# Patient Record
Sex: Male | Born: 1965 | Race: White | Hispanic: No | Marital: Married | Smoking: Never smoker
Health system: Southern US, Academic
[De-identification: ages and names within clinical notes are randomized; demographics above are authoritative.]

## PROBLEM LIST (undated history)

## (undated) DIAGNOSIS — M069 Rheumatoid arthritis, unspecified: Secondary | ICD-10-CM

## (undated) DIAGNOSIS — F419 Anxiety disorder, unspecified: Secondary | ICD-10-CM

## (undated) DIAGNOSIS — E7800 Pure hypercholesterolemia, unspecified: Secondary | ICD-10-CM

## (undated) DIAGNOSIS — I10 Essential (primary) hypertension: Secondary | ICD-10-CM

## (undated) DIAGNOSIS — K219 Gastro-esophageal reflux disease without esophagitis: Secondary | ICD-10-CM

## (undated) DIAGNOSIS — E039 Hypothyroidism, unspecified: Secondary | ICD-10-CM

## (undated) HISTORY — DX: Gastro-esophageal reflux disease without esophagitis: K21.9

## (undated) HISTORY — PX: HX APPENDECTOMY: SHX54

## (undated) HISTORY — DX: Anxiety disorder, unspecified: F41.9

## (undated) HISTORY — DX: Pure hypercholesterolemia, unspecified: E78.00

## (undated) HISTORY — PX: SPINE SURGERY: SHX786

## (undated) HISTORY — DX: Essential (primary) hypertension: I10

## (undated) HISTORY — PX: HX THYROIDECTOMY: SHX17

## (undated) HISTORY — PX: HX WISDOM TEETH EXTRACTION: SHX21

## (undated) HISTORY — PX: HX TONSILLECTOMY: SHX27

## (undated) HISTORY — DX: Rheumatoid arthritis, unspecified: M06.9

## (undated) HISTORY — DX: Hypothyroidism, unspecified: E03.9

---

## 2004-09-30 ENCOUNTER — Other Ambulatory Visit (HOSPITAL_COMMUNITY): Payer: Self-pay

## 2020-05-03 DIAGNOSIS — E039 Hypothyroidism, unspecified: Secondary | ICD-10-CM | POA: Insufficient documentation

## 2020-05-03 DIAGNOSIS — M5416 Radiculopathy, lumbar region: Secondary | ICD-10-CM | POA: Insufficient documentation

## 2020-05-03 DIAGNOSIS — K581 Irritable bowel syndrome with constipation: Secondary | ICD-10-CM | POA: Insufficient documentation

## 2020-05-03 DIAGNOSIS — E559 Vitamin D deficiency, unspecified: Secondary | ICD-10-CM | POA: Insufficient documentation

## 2020-05-03 DIAGNOSIS — F418 Other specified anxiety disorders: Secondary | ICD-10-CM | POA: Insufficient documentation

## 2020-05-03 DIAGNOSIS — K219 Gastro-esophageal reflux disease without esophagitis: Secondary | ICD-10-CM | POA: Insufficient documentation

## 2020-06-22 DIAGNOSIS — N138 Other obstructive and reflux uropathy: Secondary | ICD-10-CM | POA: Insufficient documentation

## 2020-06-30 DIAGNOSIS — N411 Chronic prostatitis: Secondary | ICD-10-CM | POA: Insufficient documentation

## 2020-12-01 IMAGING — MR MRI THORACIC SPINE WITHOUT CONTRAST
4 of 5 series · 26 of 48 positions shown · IV contrast (gadolinium)
Comparison: None available.

﻿EXAM:  [id number]   MRI THORACIC SPINE WITHOUT CONTRAST
INDICATION: Chronic back pain.
TECHNIQUE: Multiplanar multisequential MRI of the thoracic spine was performed without gadolinium contrast.

[Series 5: T2 · sagittal · 4.0mm · 0.78mm/px · 8 of 13 slices shown (1 of 2)]
[im 1/13]
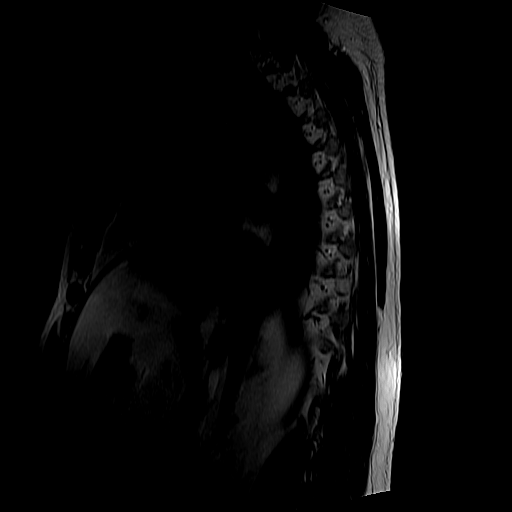
[im 2/13]
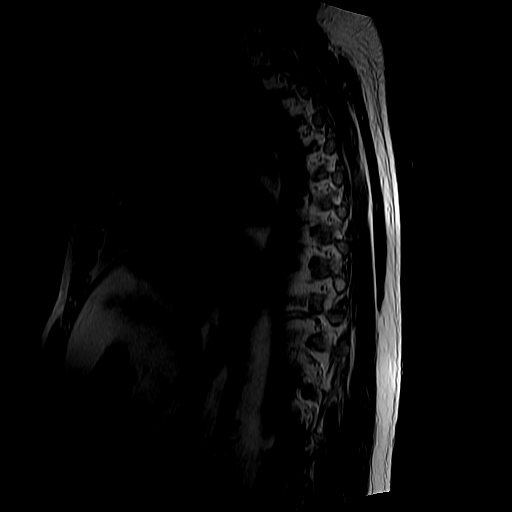
[im 5/13]
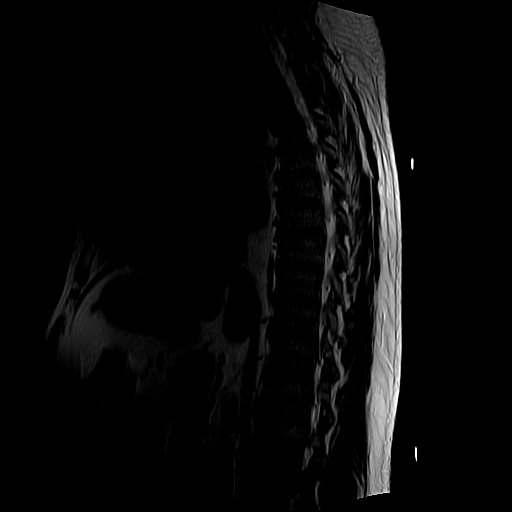
[im 6/13]
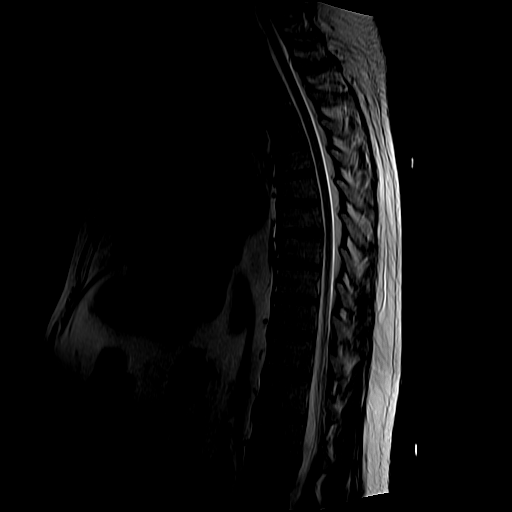
[im 7/13]
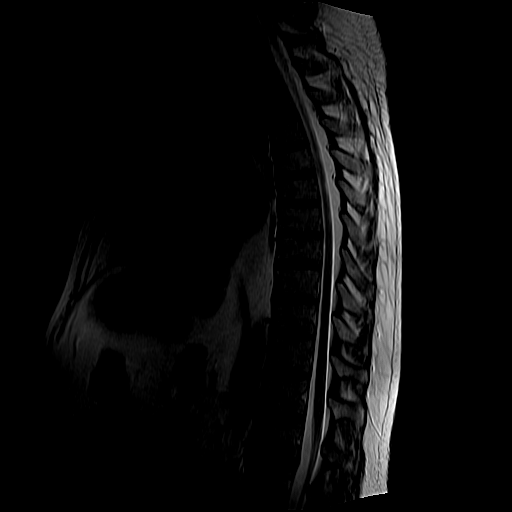
[im 9/13]
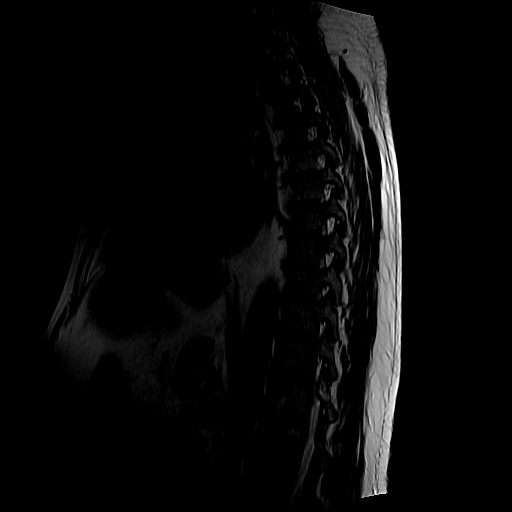
[im 11/13]
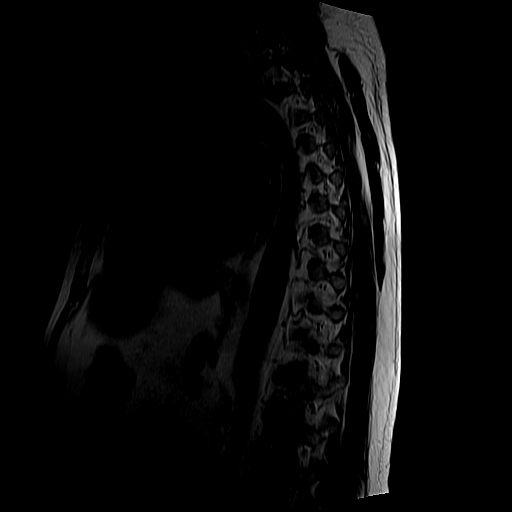
[im 13/13]
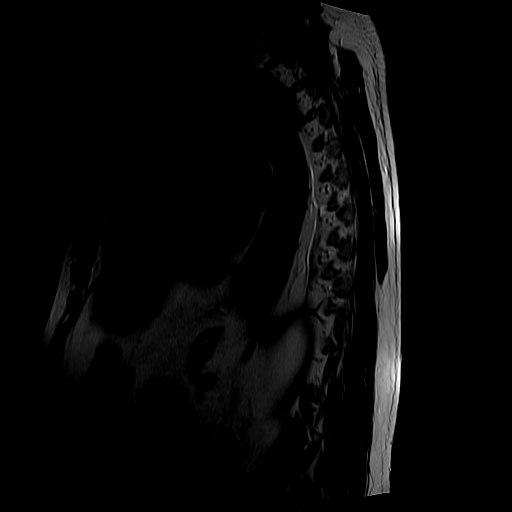

[Series 6: T1 · sagittal · 4.0mm · 0.78mm/px · 6 of 13 slices shown (1 of 2)]
[im 1/13]
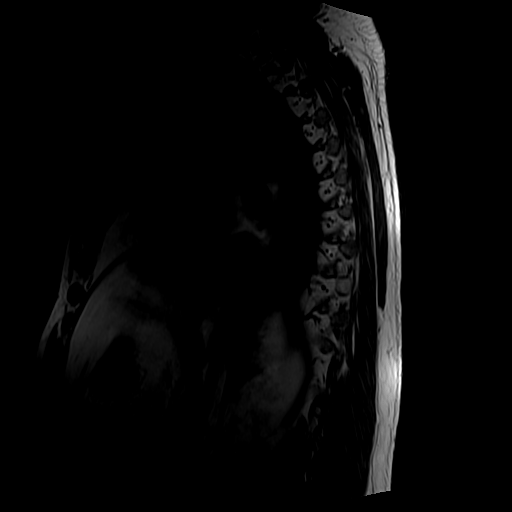
[im 2/13]
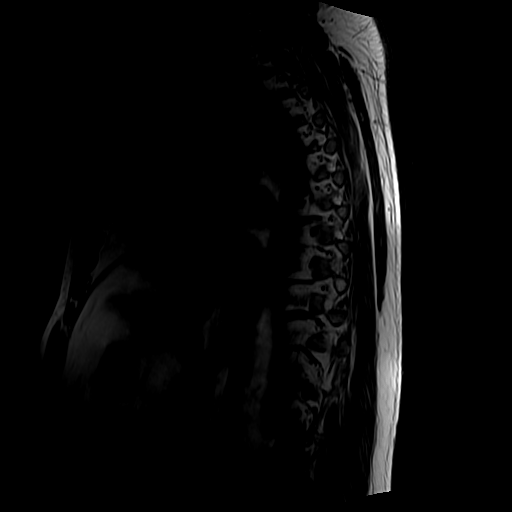
[im 5/13]
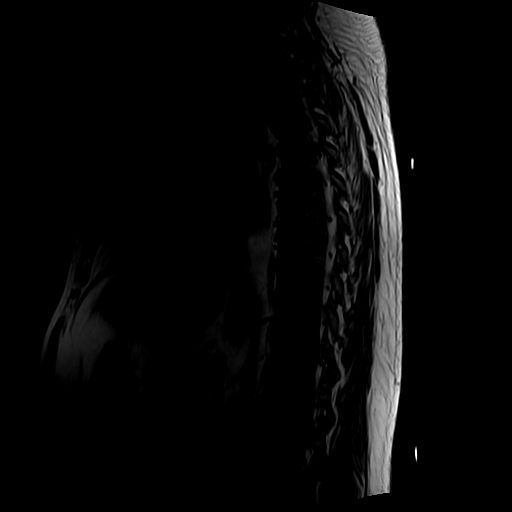
[im 6/13]
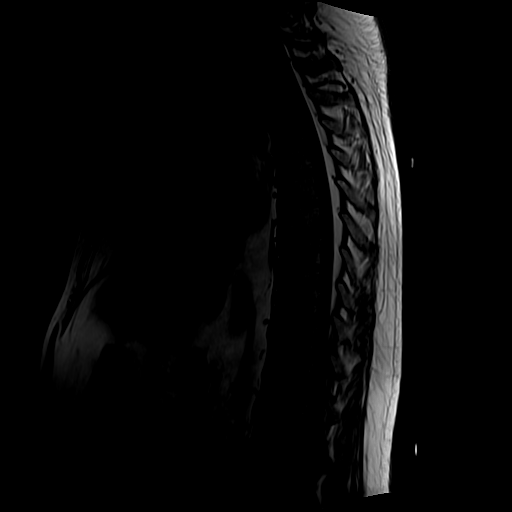
[im 7/13]
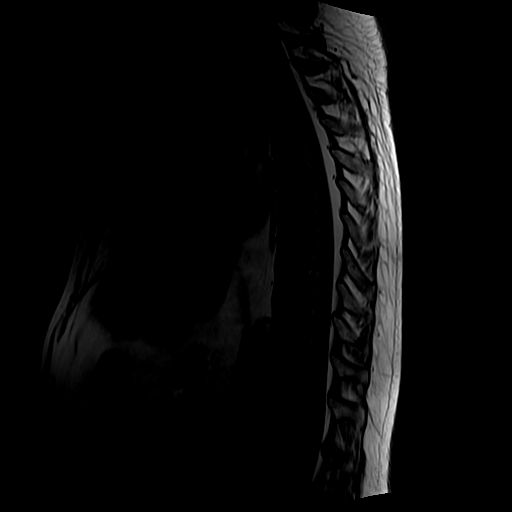
[im 11/13]
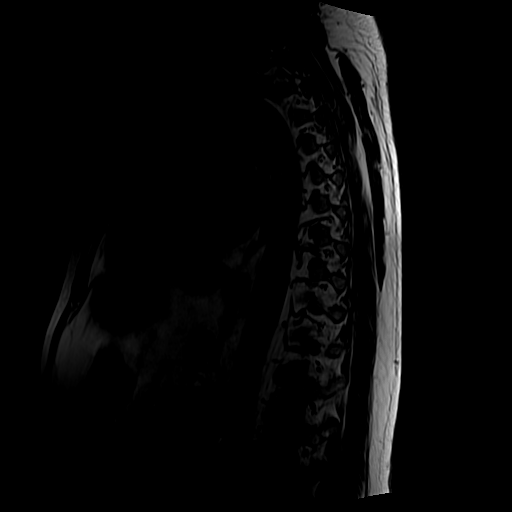

[Series 7: T2 · axial · 4.0mm · 0.62mm/px · z∈[-366,-103]mm · 9 of 12 slices shown (2 of 2)]
[im 1/12]
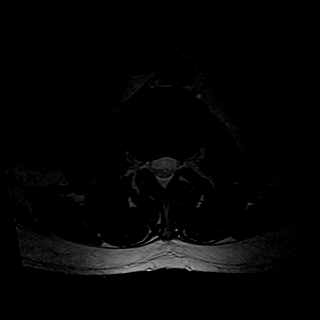
[im 2/12]
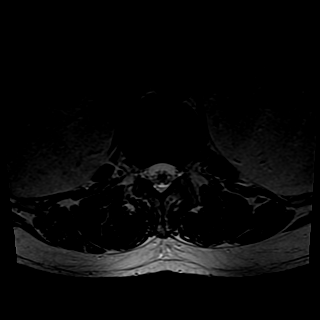
[im 3/12]
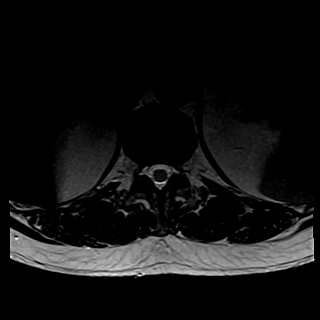
[im 5/12]
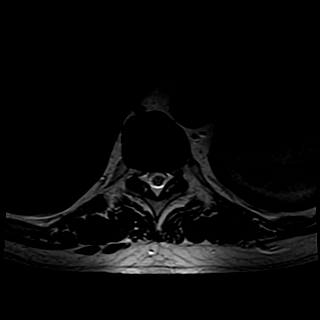
[im 6/12]
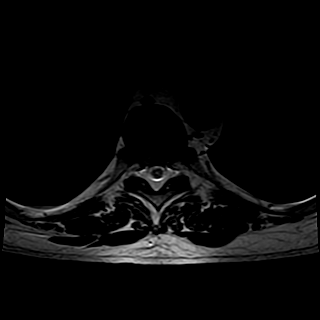
[im 7/12]
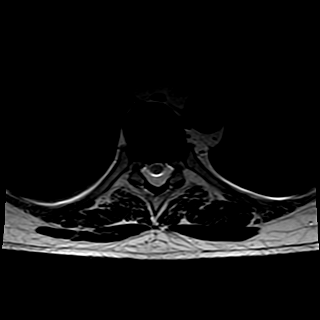
[im 9/12]
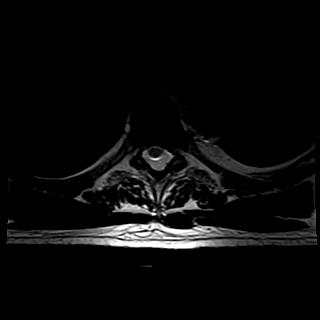
[im 10/12]
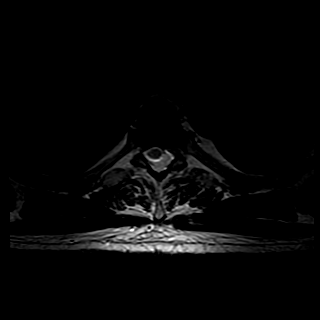
[im 12/12]
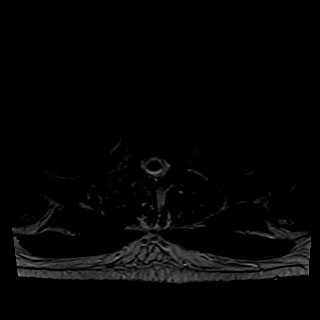

[Series 9: T1 · axial · 4.0mm · 0.62mm/px · z∈[-331,-143]mm · 3 of 12 slices shown (2 of 2)]
[im 2/12]
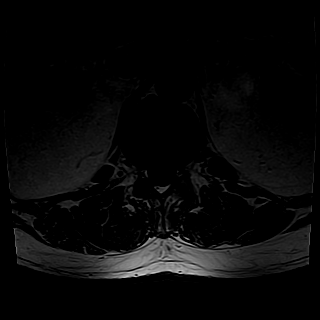
[im 6/12]
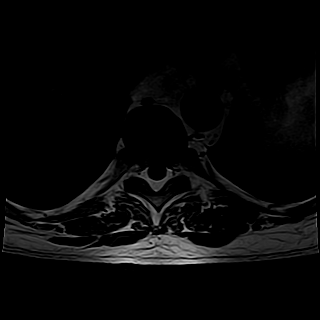
[im 10/12]
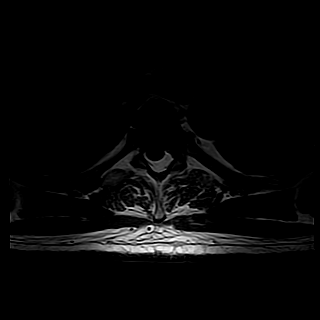

[26 of 48 positions shown; findings below may reference images not displayed]

FINDINGS: Thoracic vertebral bodies are normal in height, alignment and signal intensity. There is no acute fracture or subluxation. Visualized spinal cord is also normal in signal intensity without evidence of compression at any level.

No significant disc herniation, spinal canal or neural foraminal stenosis is seen at any level within the thoracic spine.

Paraspinal soft tissues are unremarkable. There is no pleural effusion.

Moderate to severe spinal stenosis at C3-4 and C5-6 levels is incompletely assessed.
IMPRESSION: 1. Unremarkable thoracic spine. 

2. Incompletely assessed advanced degenerative changes of the cervical spine. A dedicated MRI of the cervical spine is recommended for better assessment.

## 2020-12-10 IMAGING — CT CT HEAD W/O CONTRAST
2 series · 16 of 30 positions shown, 20 images · non-contrast
Comparison: None available.

﻿EXAM:  CT HEAD W/O CONTRAST
INDICATION: Headaches, dementia.
TECHNIQUE: Axial noncontrast CT imaging was performed from skull base to the vertex. Exam was performed using 1 or more of the following dose reduction techniques: Automated exposure control, adjustment of the mA and/or kV according to patient size, or the use of iterative reconstruction technique.

[ax · axial · 0.44mm/px · z∈[-438,-304]mm · 13 of 79 slices shown, 17 images]
[im 6/79  brain]
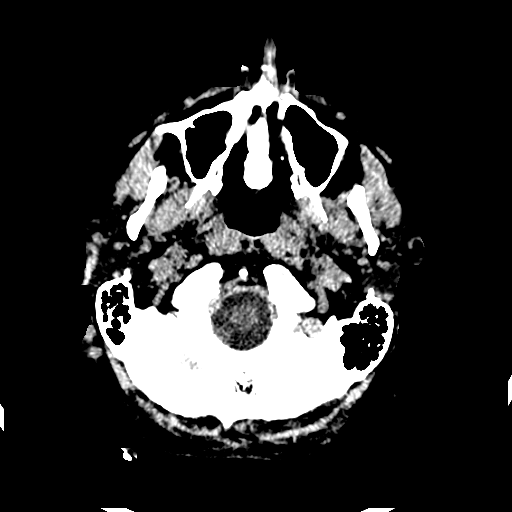
[im 6/79  bone]
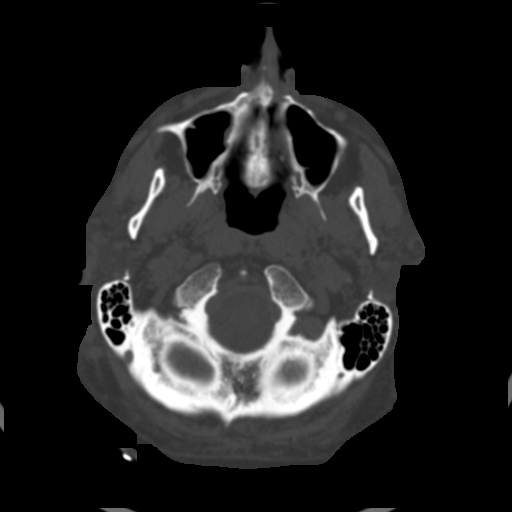
[im 12/79  brain]
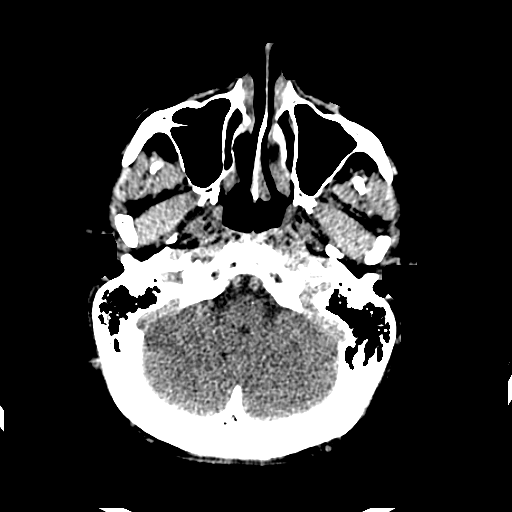
[im 17/79  brain]
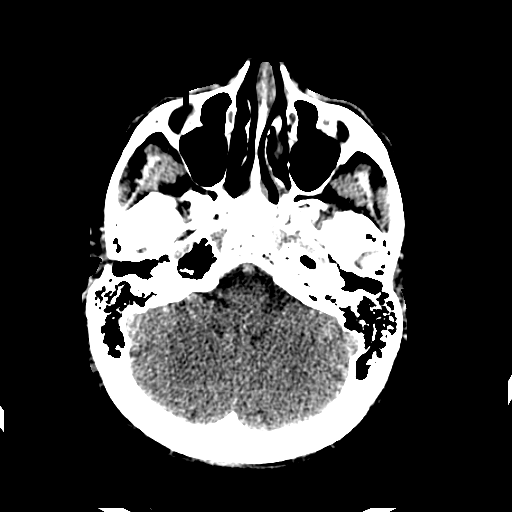
[im 23/79  brain]
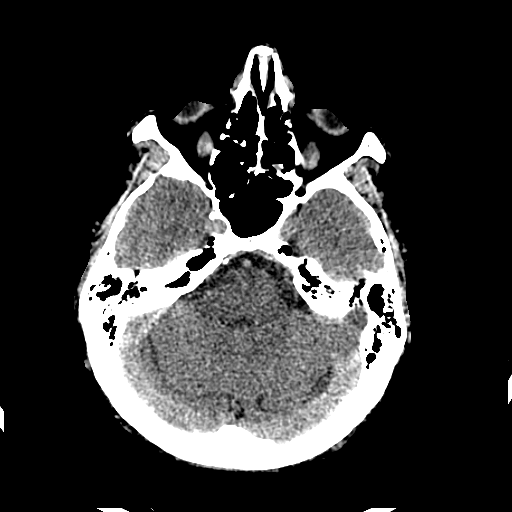
[im 28/79  brain]
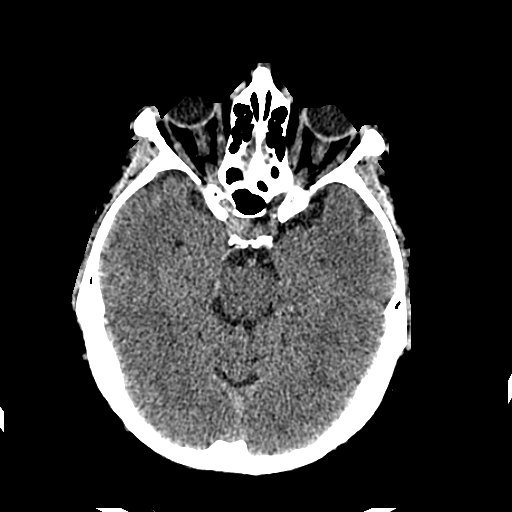
[im 28/79  bone]
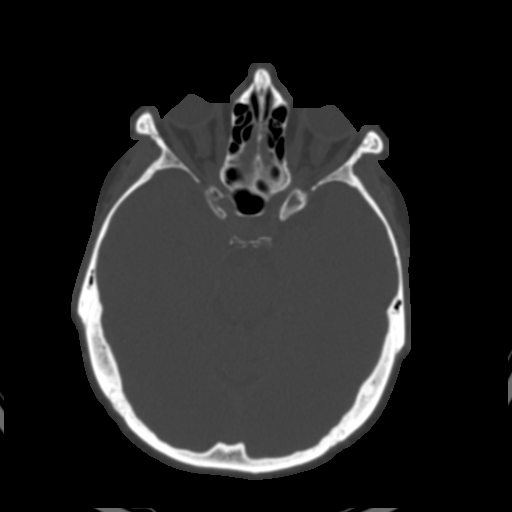
[im 34/79  brain]
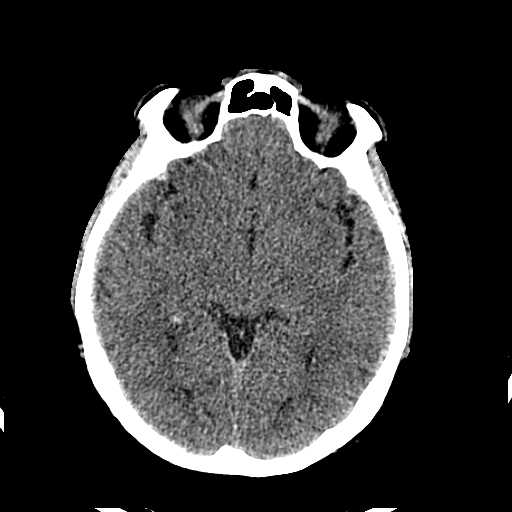
[im 40/79  brain]
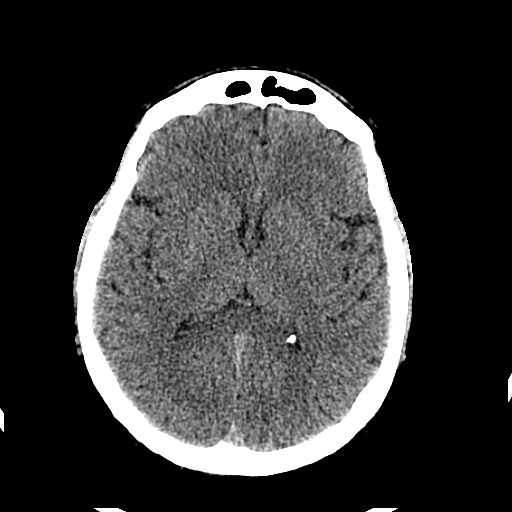
[im 45/79  brain]
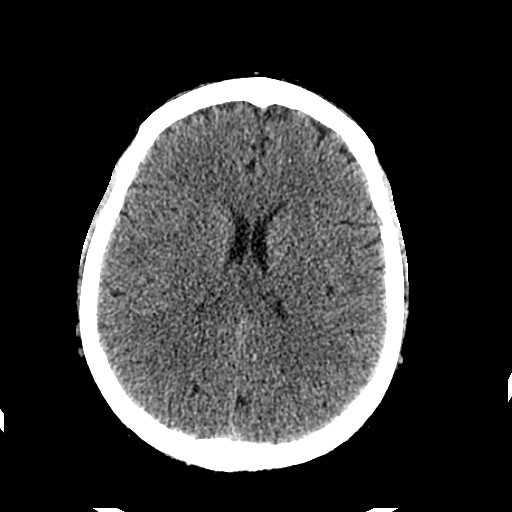
[im 51/79  brain]
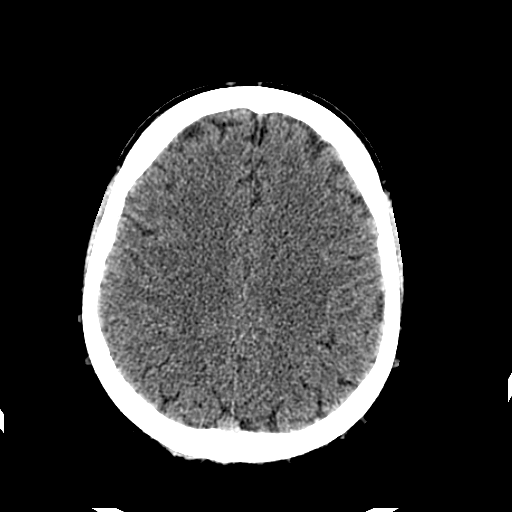
[im 51/79  bone]
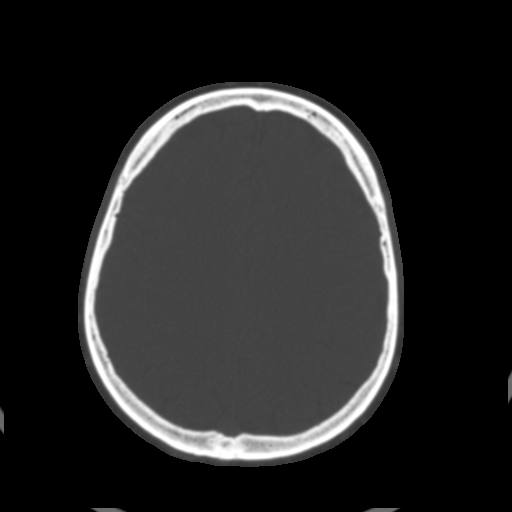
[im 56/79  brain]
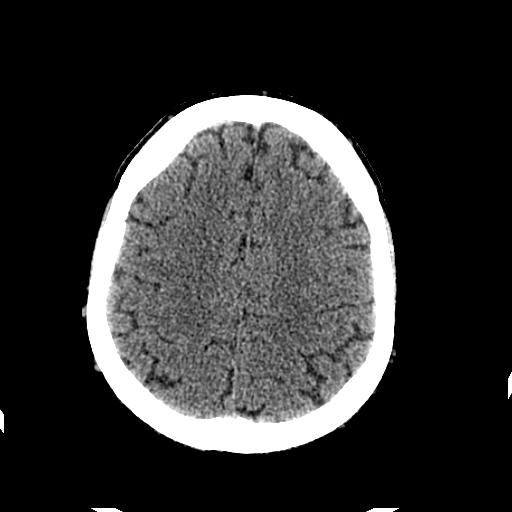
[im 62/79  brain]
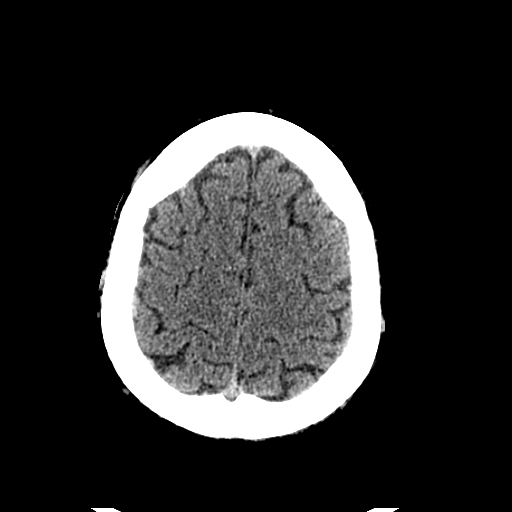
[im 67/79  brain]
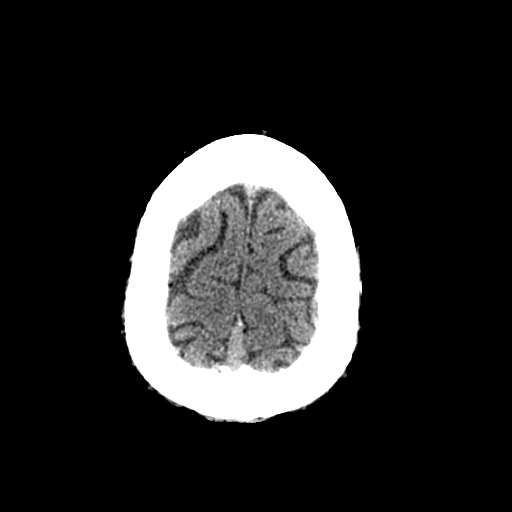
[im 73/79  brain]
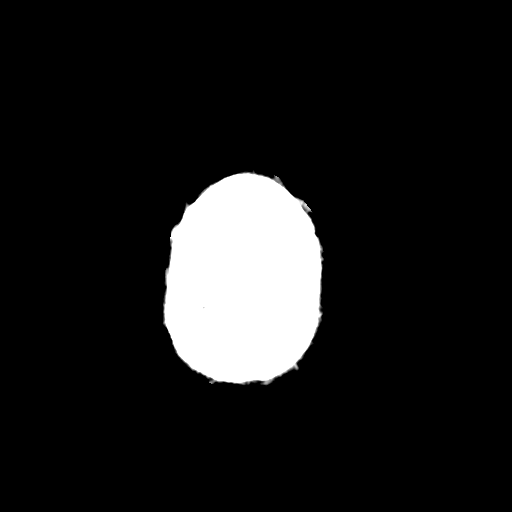
[im 73/79  bone]
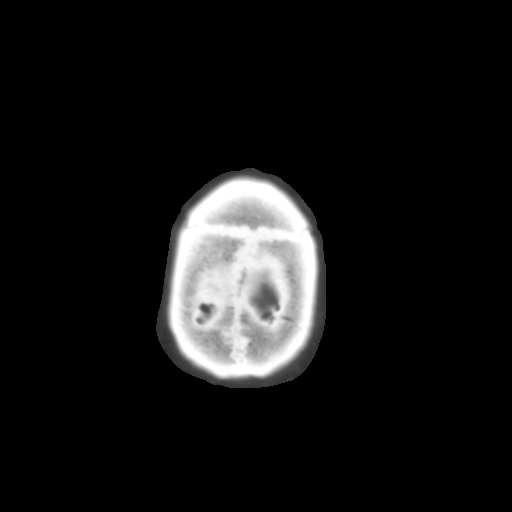

[bone · axial · 0.41mm/px · z∈[-438,-392]mm · 3 of 82 slices shown]
[im 6/82  bone]
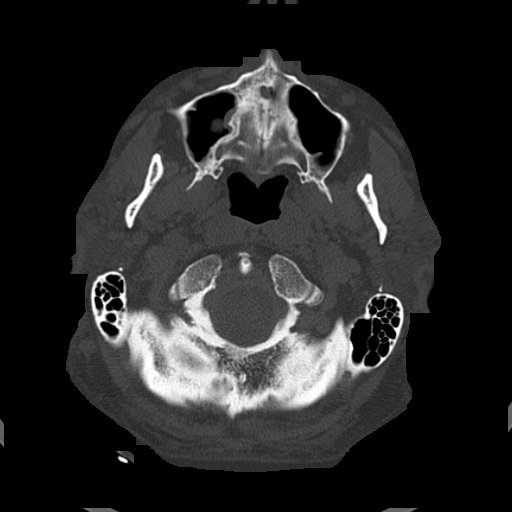
[im 18/82  bone]
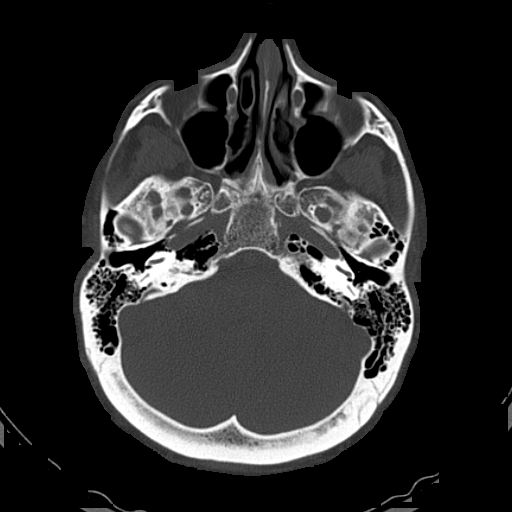
[im 29/82  bone]
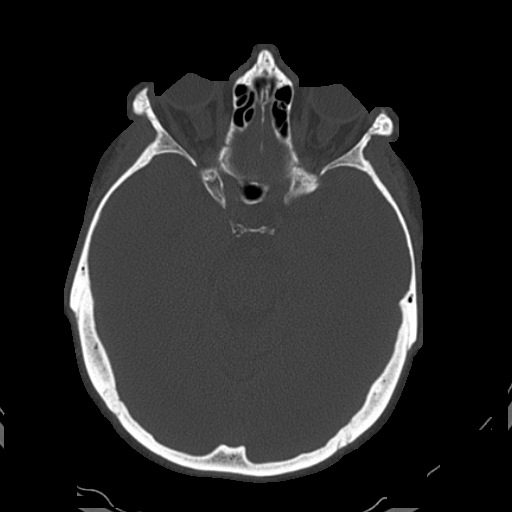

[16 of 30 positions shown; findings below may reference images not displayed]

FINDINGS: The cortical sulci and ventricular system is appropriate in size for the patient's age. There are no focal areas of abnormal attenuation within the brain parenchyma. There is no mass effect, midline shift or intracranial hemorrhage. There is no evidence of acute infarction. There are no extra-axial fluid collections. Visualized paranasal sinuses, mastoid air cells and orbital contents are also unremarkable.
IMPRESSION: Unremarkable exam.

## 2021-01-05 IMAGING — MR MRI CERVICAL SPINE WITHOUT CONTRAST
4 of 6 series · 23 of 48 positions shown · IV contrast (gadolinium)
Comparison: MRI thoracic spine dated 12/01/2020.

﻿EXAM:  [id number]   MRI CERVICAL SPINE WITHOUT CONTRAST
INDICATION: Neck pain with bilateral upper extremity numbness.
TECHNIQUE: Multiplanar multisequential MRI of the cervical spine was performed without gadolinium contrast.

[Series 5: T2 · sagittal · 3.0mm · 0.75mm/px · 7 of 15 slices shown (1 of 2)]
[im 1/15]
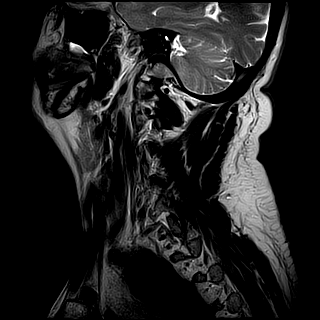
[im 3/15]
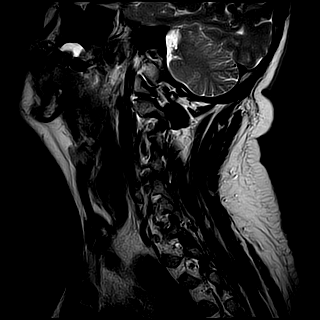
[im 5/15]
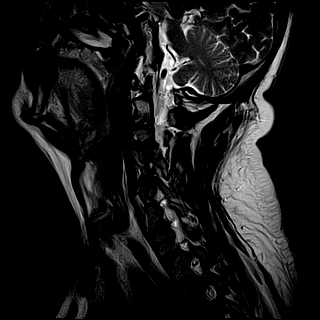
[im 8/15]
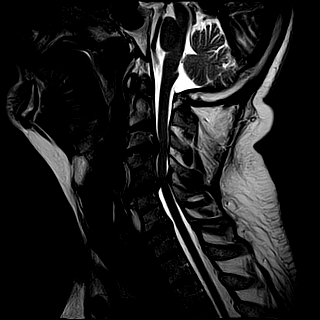
[im 10/15]
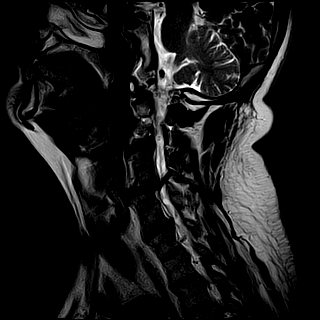
[im 12/15]
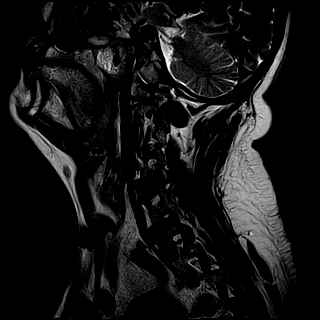
[im 15/15]
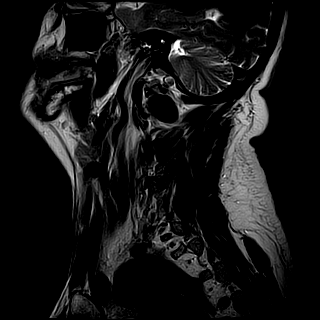

[Series 6: T1 · sagittal · 3.0mm · 0.47mm/px · 5 of 15 slices shown]
[im 1/15]
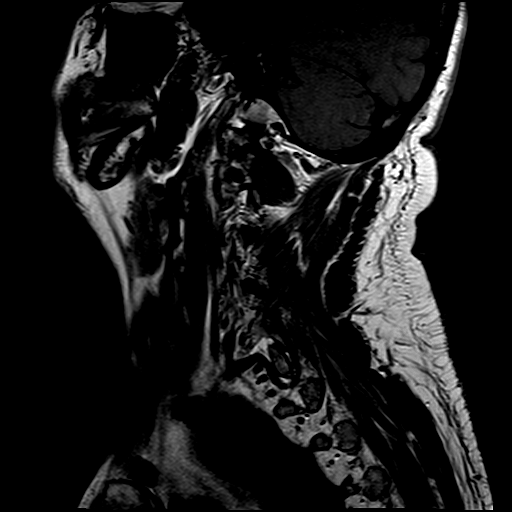
[im 3/15]
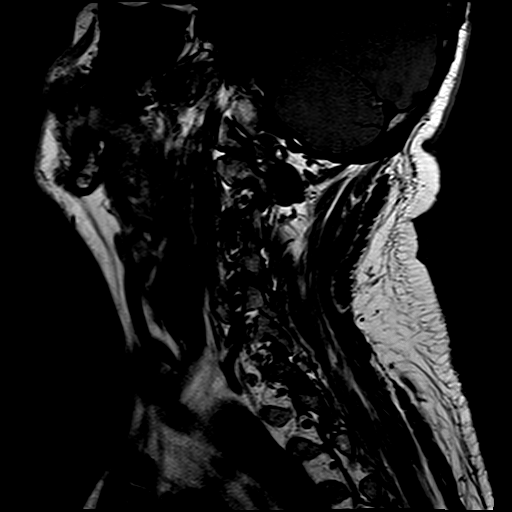
[im 5/15]
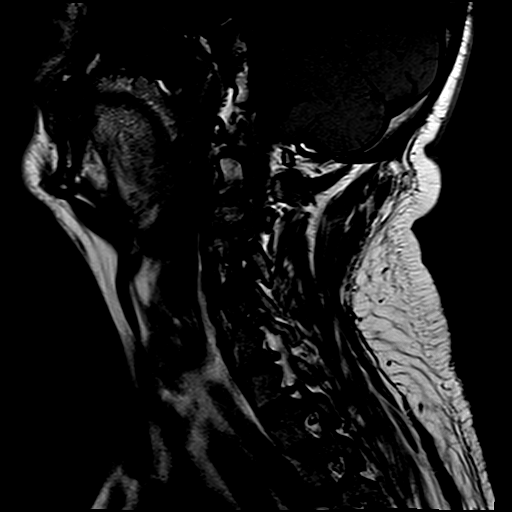
[im 8/15]
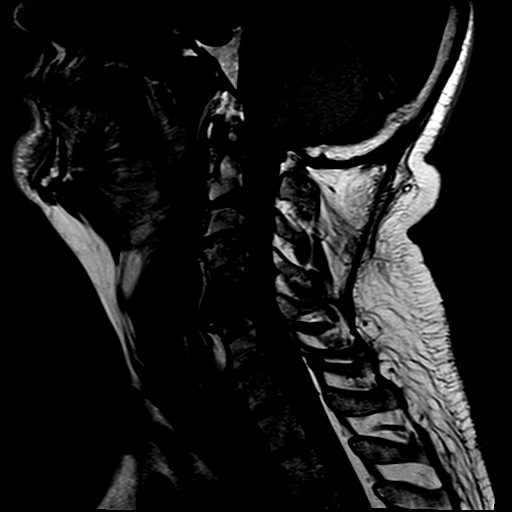
[im 12/15]
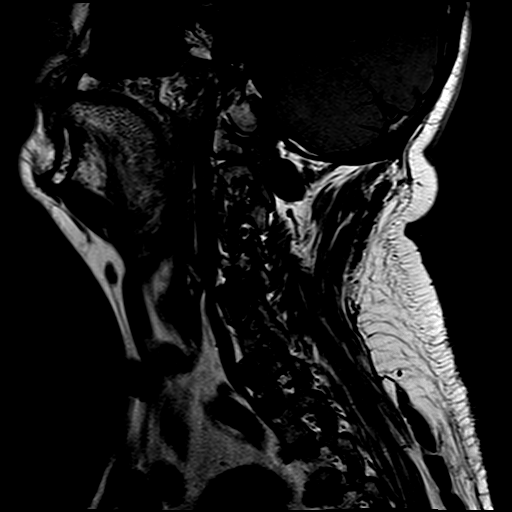

[Series 7: STIR · sagittal · 3.0mm · 0.47mm/px · 3 of 15 slices shown]
[im 3/15]
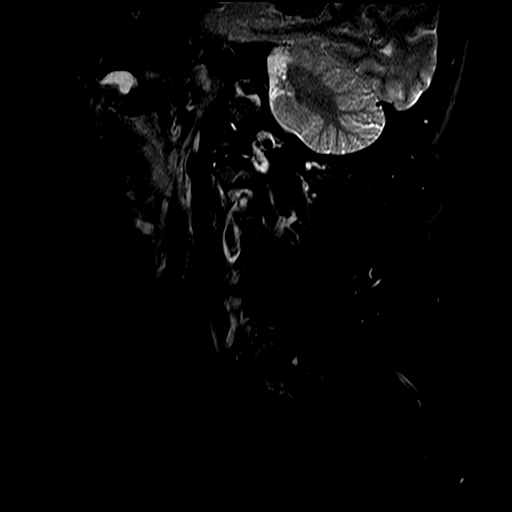
[im 8/15]
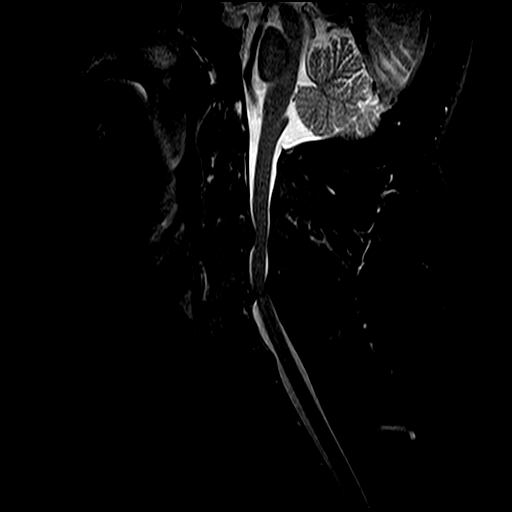
[im 12/15]
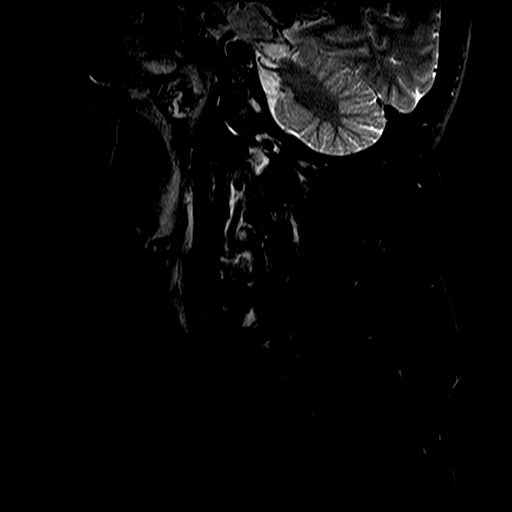

[Series 9: T2 · axial · 3.0mm · 0.39mm/px · z∈[-92,+8]mm · 8 of 18 slices shown (2 of 2)]
[im 1/18]
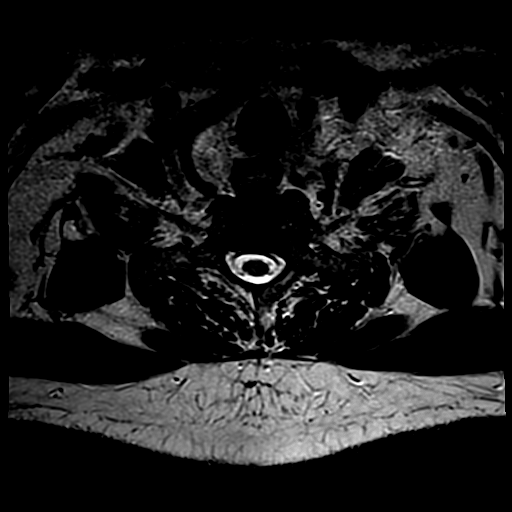
[im 3/18]
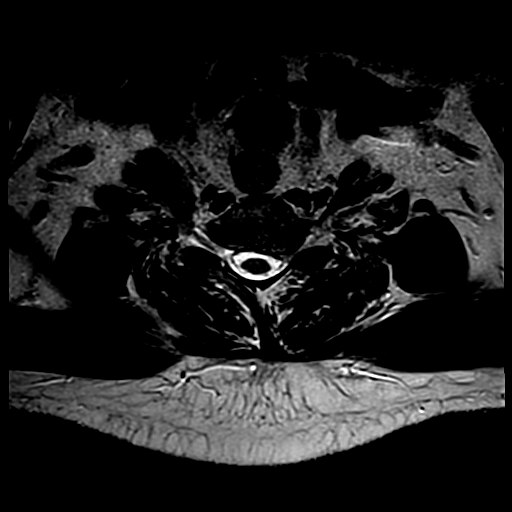
[im 5/18]
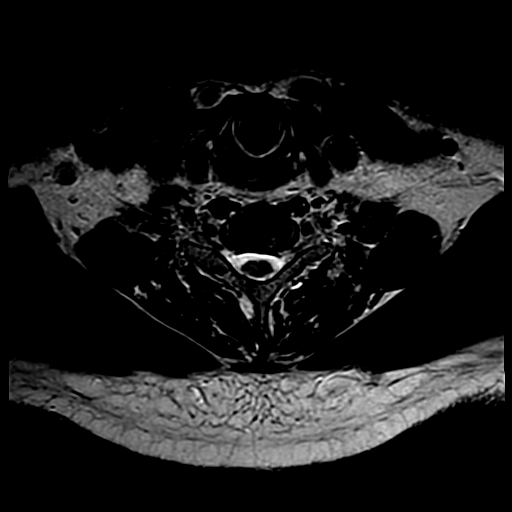
[im 8/18]
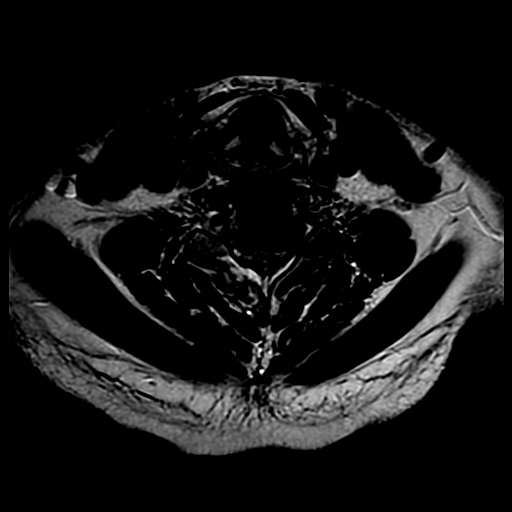
[im 10/18]
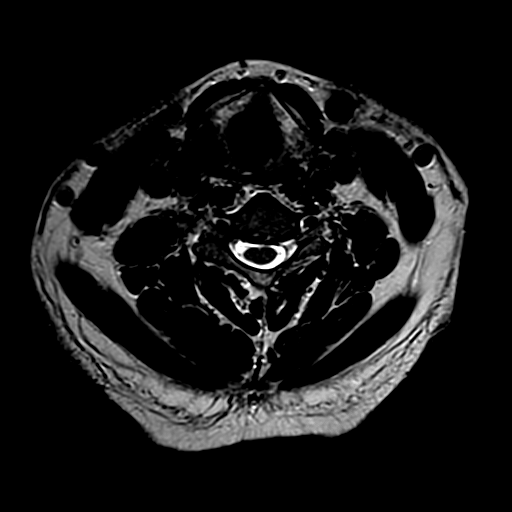
[im 13/18]
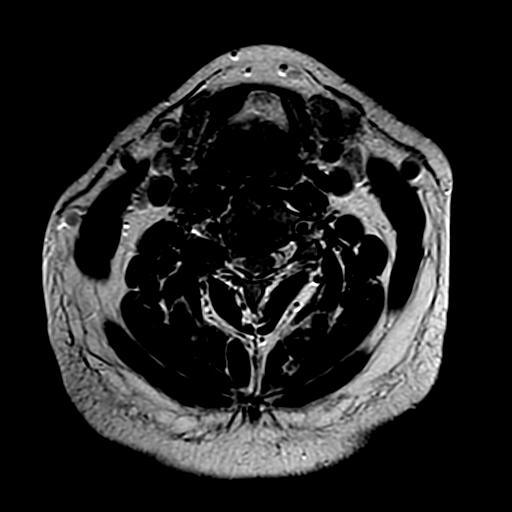
[im 15/18]
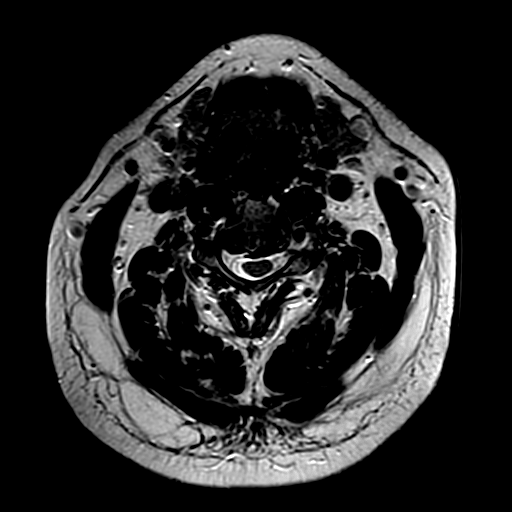
[im 18/18]
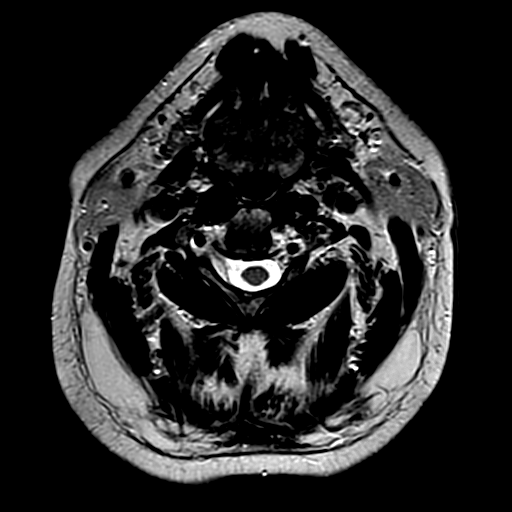

[23 of 48 positions shown; findings below may reference images not displayed]

FINDINGS: Bone marrow signal intensity is normal. There is no acute fracture or subluxation. There is congenital partial fusion of C4 and C5 vertebral bodies as well as C6 and C7 vertebral bodies.

C2-3 level is unremarkable.

At C3-4 level, there is a small to moderate broad-based central disc osteophyte complex resulting in moderate to severe spinal stenosis. There is moderate right neural foraminal stenosis from facet and uncovertebral joint hypertrophy.

C4-5 level is unremarkable.

At C5-6 level, there is marked disc desiccation. There is a small broad-based central disc osteophyte complex resulting in severe spinal stenosis. Faint T2 signal hyperintensity is also noted within the spinal cord, to the right of the midline. There is severe bilateral neural foraminal stenosis from facet and uncovertebral joint hypertrophy.

C6-7 level is unremarkable.

At C7-T1 level, there is a small broad-based central disc bulge partially effacing the ventral CSF. There is severe right neural foraminal stenosis from uncovertebral joint hypertrophy.

Paraspinal soft tissues are unremarkable.
IMPRESSION: 1. Partial congenital fusion of C4 and C5 as well as C6 and C7 vertebral bodies. 

2. Moderate to severe spinal stenosis at C3-4 level and severe spinal stenosis C5-6 level from disc osteophyte complexes. There is also suggestion of spinal cord edema at C5-6 level. Continued close clinical follow-up and prompt neurosurgical evaluation are recommended. 

3. Multilevel neural foraminal stenosis as detailed above.

## 2021-04-27 DIAGNOSIS — N3281 Overactive bladder: Secondary | ICD-10-CM | POA: Insufficient documentation

## 2021-04-27 DIAGNOSIS — N529 Male erectile dysfunction, unspecified: Secondary | ICD-10-CM | POA: Insufficient documentation

## 2021-12-09 ENCOUNTER — Ambulatory Visit
Payer: Medicaid Other | Attending: INTERNAL MEDICINE-ENDOCRINOLOGY-DIABETES AND METABOLISM | Admitting: INTERNAL MEDICINE-ENDOCRINOLOGY-DIABETES AND METABOLISM

## 2021-12-09 ENCOUNTER — Encounter (RURAL_HEALTH_CENTER): Payer: Self-pay | Admitting: INTERNAL MEDICINE-ENDOCRINOLOGY-DIABETES AND METABOLISM

## 2021-12-09 ENCOUNTER — Other Ambulatory Visit: Payer: Self-pay

## 2021-12-09 VITALS — BP 127/82 | HR 53 | Temp 97.1°F | Resp 17 | Ht 68.0 in | Wt 219.0 lb

## 2021-12-09 DIAGNOSIS — E039 Hypothyroidism, unspecified: Secondary | ICD-10-CM | POA: Insufficient documentation

## 2021-12-09 DIAGNOSIS — C73 Malignant neoplasm of thyroid gland: Secondary | ICD-10-CM

## 2021-12-09 DIAGNOSIS — Z85850 Personal history of malignant neoplasm of thyroid: Secondary | ICD-10-CM | POA: Insufficient documentation

## 2021-12-09 NOTE — Progress Notes (Addendum)
Family Medicine, [facility]  [place] [address]  [phone]    Date: 12/09/2021  Patient Name:  Roy Boyd   Age: 56 y.o.  Attending: Marena Chancy, MD - Endocrinology, Diabetes and Metabolism  PCP: Adelene Idler, FNP    I have personally reviewed allergies, chief complaint, medications, past medical history, surgical history, and family history.  I have reviewed referral notes, PCP progress notes and labs and images relevant to this visit.      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 Discussed Tg likely will be detectable if he does indeed have some thyroid tissue in neck    No follow-ups on file.    Chief Complaint:    Chief Complaint   Patient presents with    New Patient     referral from [facility]          History of Present Illness:   56year old male presents for evaluation and management of hypothyroidism.  Has a history of thyroid cancer, status post total thyroidectomy 1995.  Had radioactive iodine.  Followed with an endocrinologist and states that he had to have his dose adjusted about twice a year.  He is taking his medication consistently and correctly.  States he had some thyroid tissue or a nodule that had grown back in the thyroid bed but his endocrinologist told him it was not concerning and he has not had repeated for a long time.  Endocrinologist moved out of state.  Last TSH was 0.39.  On levothyroxine 125.  Was lowered from 150.  Discussed needs wait about 6 weeks  and have labs again.      Allergies:  Allergies   Allergen Reactions    Penicillins      Medications:    Outpatient Medications Marked as Taking for the 12/09/21 encounter (Office Visit) with ELenward Chancellor MD   Medication Sig    famotidine (PEPCID) 40 mg Oral Tablet famotidine 40 mg tablet   TAKE 1 TABLET BY MOUTH ONCE DAILY FOR 90 DAYS    gabapentin (NEURONTIN) 300 mg Oral Capsule gabapentin 300 mg capsule   TAKE 1 CAPSULE BY MOUTH TWICE DAILY    levothyroxine (SYNTHROID) 125 mcg Oral Tablet Take 1 Tablet (125 mcg total) by mouth Once a day    linaCLOtide (LINZESS) 290 mcg Oral Capsule Linzess 290 mcg capsule    losartan (COZAAR) 25 mg Oral Tablet TAKE 1 & 1/2 (ONE & ONE-HALF) TABLETS BY MOUTH ONCE DAILY    omeprazole (PRILOSEC) 40 mg Oral Capsule, Delayed Release(E.C.) Take 1 Capsule (40 mg total) by mouth Once a day    rosuvastatin (CRESTOR) 10 mg Oral Tablet Take 1 Tablet (10 mg total) by mouth Once a day    tamsulosin (FLOMAX) 0.4 mg Oral Capsule Take 1 Capsule (0.4 mg total) by mouth Once a day    vibegron (GEMTESA) 75 mg Oral Tablet Take 1 Tablet (75 mg total) by mouth Once a day  Past Medical History:  Past Medical History:   Diagnosis Date    Anxiety     Esophageal reflux     Hypercholesterolemia     Hypertension     Hypothyroidism     Rheumatoid arthritis (CMS HCC)      Past Surgical History:   Procedure Laterality Date    HX APPENDECTOMY      HX THYROIDECTOMY      HX TONSILLECTOMY      HX WISDOM TEETH EXTRACTION      SPINE SURGERY       Social History:    Social History     Socioeconomic History    Marital status: Married   Tobacco Use    Smoking status: Never     Passive exposure: Never    Smokeless tobacco: Never   Vaping Use    Vaping Use: Never used   Substance and Sexual Activity    Alcohol use: Never    Drug use: Never    Sexual activity: Yes     Partners: Female     Family History:  Family Medical History:       Problem Relation (Age of Onset)    Alzheimer's/Dementia Mother    Blood Clots  Father    Cancer Father, Brother    Hypertension (High Blood Pressure) Father    Stroke Sister, Brother          Review of Systems:   Constitutional: negative for fevers, chills, weight changes  HEENT: negative for acute vision changes or URI symptoms  Neck:  negative dysphonia, dysphagia or neck pain  Respiratory: negative for cough, sputum, dyspnea on exertion  Cardiovascular: negative for chest discomfort, palpitations  Gastrointestinal: negative for nausea, pain, stool changes  Genitourinary: negative for frequency, dysuria, nocturia, hesitancy  Hematologic/lymphatic: negative for changes in bleeding or bruisability  Musculoskeletal:negative for new myalgisa, arthralgia, and muscle weakness  Neurological: negative for new headaches, vision changes, numbness, weakness, tingling  Psychiatric: mood stable, cognition stable  Endocrine:  As per HPI.    Objective:     Vital Signs:  BP 127/82   Pulse 53   Temp 36.2 C (97.1 F)   Resp 17   Ht 1.727 m ('5\' 8"'$ )   Wt 99.3 kg (219 lb)   SpO2 97%   BMI 33.30 kg/m      Body mass index is 33.3 kg/m.    Examination:  General:  Age-appropriate, no acute distress  HEENT:  Eyes clear.  Extraocular movements intact. Moist mucous membranes.  Neck:  thyroid is surgically absent  Cardiac:  Regular rate and rhythm, no gallops, murmurs or rubs.  Pulmonary:  Clear to ausculation bilaterally.  Pulmonary effort normal.  Gastrointestinal:  Nontender, nondistended, bowel sounds positive.    Musculoskeletal:  No deformity or lesions.  Skin:  Warm and of normal moisture.  Neurologic:  Alert and oriented.  Nonfocal.  No tremor.  Gait normal.  Psychiatric:  Affect normal.    Diagnostic Review:      No results found for: HA1C  No results found for: TSH, T3UP, MTUP1, FREET4, T3, THYSTIMUNQNT, THYSTIMMUNOG  No results found for: TSH, TSHCASCADE, T4, T4FREE, T3, FREET3    Marena Chancy, MD  Endocrinology, Diabetes and Metabolism    On the day of the encounter, a total of  60 minutes was  spent on this patient encounter including review of historical information, examination, documentation and post-visit activities. The time documented excludes procedural time.    This note may have  been partially generated using MModal Fluency Direct system, and there may be some incorrect words, spellings, and punctuation that were not noted in checking the note before saving.

## 2021-12-09 NOTE — Addendum Note (Signed)
Addended by: Andree Coss on: 12/09/2021 11:21 AM     Modules accepted: Orders

## 2021-12-20 ENCOUNTER — Other Ambulatory Visit: Payer: Self-pay

## 2021-12-21 ENCOUNTER — Ambulatory Visit (HOSPITAL_COMMUNITY): Payer: Self-pay

## 2021-12-29 ENCOUNTER — Other Ambulatory Visit: Payer: Self-pay

## 2021-12-29 ENCOUNTER — Ambulatory Visit
Admission: RE | Admit: 2021-12-29 | Discharge: 2021-12-29 | Disposition: A | Discharge status: Home / Self Care | Payer: Medicaid Other | Source: Ambulatory Visit | Attending: INTERNAL MEDICINE-ENDOCRINOLOGY-DIABETES AND METABOLISM | Admitting: INTERNAL MEDICINE-ENDOCRINOLOGY-DIABETES AND METABOLISM

## 2021-12-29 DIAGNOSIS — C73 Malignant neoplasm of thyroid gland: Secondary | ICD-10-CM | POA: Insufficient documentation

## 2021-12-31 ENCOUNTER — Other Ambulatory Visit: Payer: Self-pay

## 2022-01-01 ENCOUNTER — Other Ambulatory Visit: Payer: Self-pay

## 2022-01-02 ENCOUNTER — Other Ambulatory Visit: Payer: Self-pay

## 2022-01-02 NOTE — Result Encounter Note (Signed)
Please notify patient thyroid ultrasound showed no thyroid tissue or abnormality in the thyroid bed. No evidence for any recurrence of thyroid cancer. Good news    Roy Chancy, MD  Endocrinology, Diabetes and Metabolism

## 2022-01-04 NOTE — H&P (Signed)
[facility]  Operated by [facility]  Outpatient History and Physical    Date:  01/05/2022  Name: Roy Boyd  MRN: [id number]  Age:  56 y.o.  Referring Physician: Adelene Idler, FNP  [address]  [address]  [place],  [place] [address]    Consult: Yes    PCP:  Adelene Idler, FNP    CC:    Chief Complaint   Patient presents with    Neurologic Problem     Having dizziness, A lot of arm pain in the Right side. Also having trouble being able to use the arm, A lot of burning feeling        History Obtained from:  patient and significant other    HPI:     Roy Boyd is a 56 y.o., male who presents to the Neurology clinic with Dizziness. Patient has PMH of Cervical myelopathy, Lumbar radiculopathy, HTN, Hypothyroidism, who presents today because his Othospine surgeon told him to see neurology ASAP. Reportedly, patient had C3-C6 fixation done on 06/24/2021. Immediately afterwards, it seems like patient has been having BUE weakness and stiffness, issues with dizziness and gait imbalance. Patient reports that dizziness is described as spinning sensation. Sometimes it would be followed by him passing out. Dizziness can be triggered by him moving his arm, but now it is better to the point that it only happens when he hyper extends his shoulder (like when he's wiping.) Lasts for 1-2 minutes. Happen about everytime he tries to move his arm back. Patient denies having any nausea or vomiting. Patient reports that dizziness is not positional. Patient also reports that he had urinary incontinence and bowel incontinence that have improved after the surgery. Patient also reports that he had hand dexterity issues that remained the same after surgery. Patient reports that he had headache all the time before the surgery but they have improved after it (gets one rarely.) Patient reports that he had swelling in his hand before the surgery but it decreased after the surgery. Patient reports that he still has swelling in his  hand when he wakes up from sleep.     Patient reports that current Gabapentin regimen rarely helps his symptoms but that when he was on 300 mg BID he had good response although he would get drowsy.     Past Medical History:    Past Medical History:   Diagnosis Date    Anxiety     Esophageal reflux     Hypercholesterolemia     Hypertension     Hypothyroidism     Rheumatoid arthritis (CMS HCC)            Medications:   Outpatient Medications Marked as Taking for the 01/05/22 encounter (Office Visit) with Jonita Albee, MD   Medication Sig    famotidine (PEPCID) 40 mg Oral Tablet famotidine 40 mg tablet   TAKE 1 TABLET BY MOUTH ONCE DAILY FOR 90 DAYS    gabapentin (NEURONTIN) 300 mg Oral Capsule gabapentin 300 mg capsule   TAKE 1 CAPSULE BY MOUTH TWICE DAILY    levothyroxine (SYNTHROID) 125 mcg Oral Tablet Take 1 Tablet (125 mcg total) by mouth Once a day    linaCLOtide (LINZESS) 290 mcg Oral Capsule 145 mcg    losartan (COZAAR) 25 mg Oral Tablet TAKE 1 & 1/2 (ONE & ONE-HALF) TABLETS BY MOUTH ONCE DAILY    omeprazole (PRILOSEC) 40 mg Oral Capsule, Delayed Release(E.C.) Take 1 Capsule (40 mg total) by mouth Once  a day    rosuvastatin (CRESTOR) 10 mg Oral Tablet Take 1 Tablet (10 mg total) by mouth Once a day    tamsulosin (FLOMAX) 0.4 mg Oral Capsule Take 1 Capsule (0.4 mg total) by mouth Once a day    vibegron (GEMTESA) 75 mg Oral Tablet Take 1 Tablet (75 mg total) by mouth Once a day    [DISCONTINUED] finasteride (PROSCAR) 5 mg Oral Tablet finasteride 5 mg tablet   TAKE 1 TABLET BY MOUTH ONCE DAILY       Allergies:   Allergies   Allergen Reactions    Penicillins        Family History:   Family Medical History:       Problem Relation (Age of Onset)    Alzheimer's/Dementia Mother    Blood Clots Father    Cancer Father, Brother    Hypertension (High Blood Pressure) Father    Stroke Sister, Brother              Surgical History:   Past Surgical History:   Procedure Laterality Date    HX APPENDECTOMY      HX THYROIDECTOMY       HX TONSILLECTOMY      HX WISDOM TEETH EXTRACTION      SPINE SURGERY              Social History:    Social History     Socioeconomic History    Marital status: Married   Tobacco Use    Smoking status: Never     Passive exposure: Never    Smokeless tobacco: Never   Vaping Use    Vaping Use: Never used   Substance and Sexual Activity    Alcohol use: Never    Drug use: Never    Sexual activity: Yes     Partners: Female       Review of Systems: Other than ROS in the HPI, all other systems were negative.      PHYSICAL EXAM:    BP (!) 143/72   Pulse 50   Temp 36.2 C (97.2 F)   Ht 1.727 m ('5\' 8"'$ )   Wt 98.7 kg (217 lb 9.5 oz)   SpO2 98%   BMI 33.09 kg/m         General:alert  Mental status:Alert and oriented x 3  Memory: Registration, Recall, and Following of commands is normal  Attention: Attention and Concentration are normal  Knowledge: Good  Language and Speech: Normal and Normal  Cranial nerves: CN2: Visual acuity and fields intact  CN 3,4,6: EOMI, PERRLA  CN 5Facial sensation intact  CN 7Face symmetrical  CN 8: Decreased hearing   CN 9,10: Palate symmetric and gag normal  CN 11: Sternocleidomastoid and Trapezius have normal strength.  CN 12: Tongue normal with no fasiculations or deviation  Ophthalmoscopic Exam: unable to assess. Reason: Time constraints   Muscle tone: spastic/rigid RA> LA  Motor:  Upper Extremity Right Left Lower Extremity Right Left   Deltoid 4/5 5/5 Iliopsoas 4/5 5/5   Biceps 5/5 5/5 Quads 5/5 5/5   Triceps 5/5 5/5 Hamstrings 5/5 5/5   Wrist Extension 5/5 5/5 Ankle Dorsi Flexion 5/5 5/5   Wrist Flexion 5/5 5/5 Ankle Plantar Flexion 5/5 5/5   Interossei 4/5 5/5      APB 4/5 5/5        Reflexes   RJ BJ TJ KJ AJ Plantars Hoffman's   Right 3+ 3+ 2+ 3+ 2+ Equivocal  Present  Left 3+ 3+ 2+ 3+ 2+ Equivocal Not present     Sensory: Sensory exam in the upper and lower extremities is normal except as noted:   Decreased sensation to light touch on the left upper and lower extremities  Patchy  decrease in Pin Prick most pronounced on anterior aspect of right arm and posterior aspect of right forearm and hand  Decreased proprioception in all extremities  Decreased vibratory sense proximally in BUE and distally in BLE  Gait:  Sensory ataxic gait  Coordination: Coordination is normal without tremor    Labs:   Outside labs reviewed and abnormal findings noted.    Imaging:     Cervical X-Ray:  There are multilevel degenerative changes of the cervical spine most significant at C3-C4 C5-C6 and C7-T1.  Postoperative changes consisting of posterior rods and at C3-C6 in good anatomic position with no evidence of hardware complication, fracture or loosening.      Assessment:    Roy Boyd is a 56 y.o., male who presents to the Neurology clinic with Dizziness. Symptoms mostly related to moving his right arm behind his back. He reports having severe pain and restricted motion in his arm especially during this specific movement. Patient has history of severe cervical myelopathy s/p c3-c6 fixation. Some of his symptoms improved after surgery but dizziness started after. Dizziness described as vertigo. Exam shows deficits that are consistent with his known cervical myelopathy. Labs & Imaging were reviewed but there is no record of previously done MRIs. Differential Diagnoses include dizziness due to pain vs brainstem ischemic injury vs structural compression of nerves/vasculature in the setting of previous surgery.       ICD-10-CM    1. Vertigo  R42 MRI BRAIN W/WO CONTRAST     MRI SPINE CERVICAL W/WO CONTRAST     CT ANGIO INTRACRANIAL W/WO IV CONTRAST     CT ANGIO CAROTID-EXTRACRANIAL (NECK) W IV CONTRAST      2. 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   - Will do MRI Brain W/WO contrast to rule out any ischemic/structural intracranial abnormalities that might be contributing to dizziness  - Will do MRI Cervical Spine W/WO contrast given onset of symptoms following cervical surgery  - Will do CTA Intra/extracranial to rule out any vascular abnormalities or compression that could be contributing to dizziness spells.   - Will do EMG to rule out radiculopathy that could be contributing to his right arm burning sensation  - Recommend that PCP change Gabapentin dose to become 200 in the morning 200 in the evening.   - Recommend that PCP consider doing ABI testing as patient and his wife report that his feet become purple after sitting for a prolonged period of time.     Will see patient in 6 months or sooner after testing is done.     Orders Placed This Encounter    MRI BRAIN W/WO CONTRAST    MRI SPINE CERVICAL W/WO CONTRAST    CT ANGIO INTRACRANIAL W/WO IV CONTRAST    CT ANGIO CAROTID-EXTRACRANIAL (NECK) W IV CONTRAST    NCS/EMG  Jonita Albee, MD, 01/04/2022  PGY-2, Neurology      I saw and examined the patient.  I reviewed the resident's note.  I agree with the findings and plan of care as documented in the resident's note.  Any exceptions/additions are edited/noted.    Gemma Payor, MD

## 2022-01-05 ENCOUNTER — Ambulatory Visit (INDEPENDENT_AMBULATORY_CARE_PROVIDER_SITE_OTHER): Payer: Self-pay

## 2022-01-05 ENCOUNTER — Ambulatory Visit: Payer: Medicaid Other | Attending: Neurology

## 2022-01-05 ENCOUNTER — Encounter (INDEPENDENT_AMBULATORY_CARE_PROVIDER_SITE_OTHER): Payer: Self-pay

## 2022-01-05 ENCOUNTER — Other Ambulatory Visit: Payer: Self-pay

## 2022-01-05 VITALS — BP 143/72 | HR 50 | Temp 97.2°F | Ht 68.0 in | Wt 217.6 lb

## 2022-01-05 DIAGNOSIS — Z9889 Other specified postprocedural states: Secondary | ICD-10-CM | POA: Insufficient documentation

## 2022-01-05 DIAGNOSIS — R42 Dizziness and giddiness: Secondary | ICD-10-CM | POA: Insufficient documentation

## 2022-01-05 DIAGNOSIS — G8928 Other chronic postprocedural pain: Secondary | ICD-10-CM | POA: Insufficient documentation

## 2022-01-05 DIAGNOSIS — M542 Cervicalgia: Secondary | ICD-10-CM | POA: Insufficient documentation

## 2022-01-05 NOTE — Telephone Encounter (Signed)
New patient to our office, His records were not sent over to [place]. The patient and the provider requested for [place] to obtain the records. I had the patient sign a records release form for [place] to obtain the records from [facility] and [facility]  Faxed the release to both offices. Also scanned the forms over to medical records to be scanned into patients chart.    Gae Bon, Ambulatory Care Assistant

## 2022-02-06 ENCOUNTER — Ambulatory Visit (HOSPITAL_BASED_OUTPATIENT_CLINIC_OR_DEPARTMENT_OTHER)
Admission: RE | Admit: 2022-02-06 | Discharge: 2022-02-06 | Disposition: A | Discharge status: Home / Self Care | Payer: Medicaid Other | Source: Ambulatory Visit

## 2022-02-06 ENCOUNTER — Encounter (RURAL_HEALTH_CENTER): Payer: Self-pay

## 2022-02-06 ENCOUNTER — Other Ambulatory Visit: Payer: Self-pay

## 2022-02-06 ENCOUNTER — Ambulatory Visit
Admission: RE | Admit: 2022-02-06 | Discharge: 2022-02-06 | Disposition: A | Discharge status: Home / Self Care | Payer: Medicaid Other | Source: Ambulatory Visit | Attending: Neurology | Admitting: Neurology

## 2022-02-06 DIAGNOSIS — M4803 Spinal stenosis, cervicothoracic region: Secondary | ICD-10-CM

## 2022-02-06 DIAGNOSIS — G8928 Other chronic postprocedural pain: Secondary | ICD-10-CM | POA: Insufficient documentation

## 2022-02-06 DIAGNOSIS — G9589 Other specified diseases of spinal cord: Secondary | ICD-10-CM

## 2022-02-06 DIAGNOSIS — K029 Dental caries, unspecified: Secondary | ICD-10-CM

## 2022-02-06 DIAGNOSIS — R42 Dizziness and giddiness: Secondary | ICD-10-CM

## 2022-02-06 DIAGNOSIS — Z9889 Other specified postprocedural states: Secondary | ICD-10-CM

## 2022-02-06 DIAGNOSIS — K056 Periodontal disease, unspecified: Secondary | ICD-10-CM

## 2022-02-06 DIAGNOSIS — M47812 Spondylosis without myelopathy or radiculopathy, cervical region: Secondary | ICD-10-CM | POA: Insufficient documentation

## 2022-02-06 DIAGNOSIS — M1909 Primary osteoarthritis, other specified site: Secondary | ICD-10-CM

## 2022-02-06 DIAGNOSIS — M542 Cervicalgia: Secondary | ICD-10-CM

## 2022-02-06 DIAGNOSIS — Z981 Arthrodesis status: Secondary | ICD-10-CM

## 2022-02-06 DIAGNOSIS — M4802 Spinal stenosis, cervical region: Secondary | ICD-10-CM

## 2022-02-06 LAB — POC ISTAT CREATININE (RESULT)
CREATININE, POC: 1.2 mg/dL (ref 0.62–1.27)
ESTIMATED GFR: 70 mL/min/{1.73_m2}

## 2022-02-06 MED ORDER — IOPAMIDOL 370 MG IODINE/ML (76 %) INTRAVENOUS SOLUTION
50.0000 mL | INTRAVENOUS | Status: AC
Start: 2022-02-06 — End: 2022-02-06
  Administered 2022-02-06: 50 mL via INTRAVENOUS

## 2022-02-06 MED ORDER — GADOBUTROL 1 MMOL/ML (604.72 MG/ML) INTRAVENOUS SOLUTION
9.5000 mL | INTRAVENOUS | Status: AC
Start: 2022-02-06 — End: 2022-02-06
  Administered 2022-02-06: 9.5 mL via INTRAVENOUS

## 2022-02-06 NOTE — Nursing Note (Signed)
Patient's wife Clyde Canterbury called wanting his lab orders faxed to [facility] in [place]. I faxed orders to [phone], and I received a confirmation that [facility] received the orders.

## 2022-02-10 ENCOUNTER — Other Ambulatory Visit (RURAL_HEALTH_CENTER): Payer: Self-pay | Admitting: INTERNAL MEDICINE-ENDOCRINOLOGY-DIABETES AND METABOLISM

## 2022-02-10 MED ORDER — LEVOTHYROXINE 137 MCG TABLET
137.0000 ug | ORAL_TABLET | Freq: Every morning | ORAL | 1 refills | Status: DC
Start: 2022-02-10 — End: 2022-08-07

## 2022-03-22 ENCOUNTER — Ambulatory Visit (HOSPITAL_COMMUNITY): Payer: Self-pay

## 2022-06-12 ENCOUNTER — Ambulatory Visit (HOSPITAL_COMMUNITY): Payer: Self-pay

## 2022-06-13 ENCOUNTER — Ambulatory Visit (RURAL_HEALTH_CENTER): Payer: Self-pay | Admitting: INTERNAL MEDICINE-ENDOCRINOLOGY-DIABETES AND METABOLISM

## 2022-07-19 ENCOUNTER — Ambulatory Visit (INDEPENDENT_AMBULATORY_CARE_PROVIDER_SITE_OTHER): Payer: Self-pay

## 2022-07-19 NOTE — Telephone Encounter (Signed)
Told pt to call after EMG we have no openings they can go over results over phone

## 2022-07-19 NOTE — Telephone Encounter (Signed)
Regarding: Bata  ----- Message from Ames Dura sent at 07/19/2022 10:46 AM EDT -----  Patient is needing to reschedule the appointment for May. Can you please reach out to reschedule.

## 2022-07-20 ENCOUNTER — Ambulatory Visit (INDEPENDENT_AMBULATORY_CARE_PROVIDER_SITE_OTHER): Payer: Self-pay

## 2022-08-06 ENCOUNTER — Other Ambulatory Visit (RURAL_HEALTH_CENTER): Payer: Self-pay | Admitting: INTERNAL MEDICINE-ENDOCRINOLOGY-DIABETES AND METABOLISM

## 2022-08-06 DIAGNOSIS — C73 Malignant neoplasm of thyroid gland: Secondary | ICD-10-CM

## 2022-08-07 NOTE — Telephone Encounter (Signed)
Pharmacy faxed refill request.  Nmt, lpn

## 2022-08-25 ENCOUNTER — Inpatient Hospital Stay
Admission: RE | Admit: 2022-08-25 | Discharge: 2022-08-25 | Disposition: A | Discharge status: Home / Self Care | Payer: 59 | Source: Ambulatory Visit | Attending: Neurology | Admitting: Neurology

## 2022-08-25 ENCOUNTER — Other Ambulatory Visit: Payer: Self-pay

## 2022-08-25 DIAGNOSIS — Z9889 Other specified postprocedural states: Secondary | ICD-10-CM | POA: Insufficient documentation

## 2022-08-25 DIAGNOSIS — M542 Cervicalgia: Secondary | ICD-10-CM | POA: Insufficient documentation

## 2022-08-25 DIAGNOSIS — G8928 Other chronic postprocedural pain: Secondary | ICD-10-CM | POA: Insufficient documentation

## 2022-09-01 ENCOUNTER — Telehealth (INDEPENDENT_AMBULATORY_CARE_PROVIDER_SITE_OTHER): Payer: Self-pay

## 2022-09-01 NOTE — Telephone Encounter (Cosign Needed)
Called patient and informed him about results of all the testing that was ordered. Discussed the changes that are seen on the MRI C spine and that it is likely causing his symptoms, but this has been addressed previously by his cervical surgery. I also informed him that he has Carpal tunnel syndrome. He would like to try wrist splints before surgical options. He had no questions or concerns. Will call back if he needs referral to hand surgery.

## 2022-09-06 ENCOUNTER — Ambulatory Visit
Payer: 59 | Attending: INTERNAL MEDICINE-ENDOCRINOLOGY-DIABETES AND METABOLISM | Admitting: INTERNAL MEDICINE-ENDOCRINOLOGY-DIABETES AND METABOLISM

## 2022-09-06 ENCOUNTER — Other Ambulatory Visit: Payer: 59 | Attending: INTERNAL MEDICINE-ENDOCRINOLOGY-DIABETES AND METABOLISM

## 2022-09-06 ENCOUNTER — Encounter (RURAL_HEALTH_CENTER): Payer: Self-pay | Admitting: INTERNAL MEDICINE-ENDOCRINOLOGY-DIABETES AND METABOLISM

## 2022-09-06 ENCOUNTER — Other Ambulatory Visit: Payer: Self-pay

## 2022-09-06 VITALS — BP 138/87 | HR 104 | Temp 97.3°F | Resp 17 | Ht 67.99 in | Wt 224.0 lb

## 2022-09-06 DIAGNOSIS — R799 Abnormal finding of blood chemistry, unspecified: Secondary | ICD-10-CM | POA: Insufficient documentation

## 2022-09-06 DIAGNOSIS — C73 Malignant neoplasm of thyroid gland: Secondary | ICD-10-CM | POA: Insufficient documentation

## 2022-09-06 DIAGNOSIS — Z978 Presence of other specified devices: Secondary | ICD-10-CM | POA: Insufficient documentation

## 2022-09-06 DIAGNOSIS — R5383 Other fatigue: Secondary | ICD-10-CM | POA: Insufficient documentation

## 2022-09-06 DIAGNOSIS — Z85850 Personal history of malignant neoplasm of thyroid: Secondary | ICD-10-CM | POA: Insufficient documentation

## 2022-09-06 DIAGNOSIS — Z79890 Hormone replacement therapy (postmenopausal): Secondary | ICD-10-CM | POA: Insufficient documentation

## 2022-09-06 DIAGNOSIS — E890 Postprocedural hypothyroidism: Secondary | ICD-10-CM | POA: Insufficient documentation

## 2022-09-06 DIAGNOSIS — Z9889 Other specified postprocedural states: Secondary | ICD-10-CM | POA: Insufficient documentation

## 2022-09-06 LAB — COMPREHENSIVE METABOLIC PANEL, NON-FASTING
ALBUMIN: 3.9 g/dL (ref 3.5–5.0)
ALKALINE PHOSPHATASE: 68 U/L (ref 45–115)
ALT (SGPT): 22 U/L (ref 10–55)
ANION GAP: 6 mmol/L (ref 4–13)
AST (SGOT): 43 U/L (ref 8–45)
BILIRUBIN TOTAL: 0.5 mg/dL (ref 0.3–1.3)
BUN/CREA RATIO: 21 (ref 6–22)
BUN: 20 mg/dL (ref 8–25)
CALCIUM: 9.4 mg/dL (ref 8.6–10.2)
CHLORIDE: 108 mmol/L (ref 96–111)
CO2 TOTAL: 24 mmol/L (ref 22–30)
CREATININE: 0.96 mg/dL (ref 0.75–1.35)
ESTIMATED GFR - MALE: >90 mL/min/BSA (ref >=60)
GLUCOSE: 119 mg/dL (ref 65–125)
POTASSIUM: 3.9 mmol/L (ref 3.5–5.1)
PROTEIN TOTAL: 7.5 g/dL (ref 6.4–8.3)
SODIUM: 138 mmol/L (ref 136–145)

## 2022-09-06 LAB — CBC WITH DIFF
BASOPHIL #: <0.1 10*3/uL (ref <=0.20)
BASOPHIL %: 1 %
EOSINOPHIL #: 0.12 10*3/uL (ref <=0.50)
EOSINOPHIL %: 2 %
HCT: 51.4 % (ref 38.9–52.0)
HGB: 17.2 g/dL (ref 13.4–17.5)
IMMATURE GRANULOCYTE #: <0.1 10*3/uL (ref <0.10)
IMMATURE GRANULOCYTE %: 0 % (ref 0.0–1.0)
LYMPHOCYTE #: 1.7 10*3/uL (ref 1.00–4.80)
LYMPHOCYTE %: 26 %
MCH: 27.5 pg (ref 26.0–32.0)
MCHC: 33.5 g/dL (ref 31.0–35.5)
MCV: 82.2 fL (ref 78.0–100.0)
MONOCYTE #: 0.5 10*3/uL (ref 0.20–1.10)
MONOCYTE %: 8 %
MPV: 9.4 fL (ref 8.7–12.5)
NEUTROPHIL #: 4.21 10*3/uL (ref 1.50–7.70)
NEUTROPHIL %: 63 %
PLATELETS: 235 10*3/uL (ref 150–400)
RBC: 6.25 10*6/uL — ABNORMAL HIGH (ref 4.50–6.10)
RDW-CV: 13.1 % (ref 11.5–15.5)
WBC: 6.6 10*3/uL (ref 3.7–11.0)

## 2022-09-06 LAB — IRON TRANSFERRIN AND TIBC
IRON (TRANSFERRIN) SATURATION: 21 % (ref 15–50)
IRON: 84 ug/dL (ref 55–175)
TOTAL IRON BINDING CAPACITY: 405 ug/dL (ref 252–504)
TRANSFERRIN: 289 mg/dL (ref 180–360)

## 2022-09-06 LAB — THYROXINE, FREE (FREE T4): THYROXINE (T4), FREE: 1.13 ng/dL (ref 0.70–1.48)

## 2022-09-06 LAB — VITAMIN D 25 TOTAL: VITAMIN D 25, TOTAL: 31.7 ng/mL (ref 30.0–100.0)

## 2022-09-06 LAB — VITAMIN B12: VITAMIN B 12: 456 pg/mL (ref 200–900)

## 2022-09-06 LAB — THYROID STIMULATING HORMONE (SENSITIVE TSH): TSH: 2.609 u[IU]/mL (ref 0.350–4.940)

## 2022-09-06 NOTE — Progress Notes (Signed)
Family Medicine, [facility]  [address]  [place] [address]  [phone]    Date: 09/06/2022  Patient Name:  Roy Boyd.   Age: 57 y.o.  Attending: Sheryle Hail, MD - Endocrinology, Diabetes and Metabolism  PCP: Brunetta Genera, FNP    I have personally reviewed allergies, chief complaint, medications, past medical history, surgical history, and family history.  I have reviewed referral notes, PCP progress notes and labs and images relevant to this visit.      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 History of C spine surgery with hardware placement  - severe physical limitations given surgery, but states he was going to be paralyzed otherwise    No follow-ups on file.    Chief Complaint:    Chief Complaint   Patient presents with    Follow Up 6 Months     History of Present Illness:   57 year old male presents for evaluation and management of hypothyroidism.  Has a history of thyroid cancer, status post total thyroidectomy 1995.  Had radioactive iodine.  Followed with an endocrinologist and states that he had to have his dose adjusted about twice a year.  He is taking his medication consistently and correctly.  States he had some thyroid tissue or a nodule that had grown back in the thyroid bed but his endocrinologist told him it was not concerning and he has not had repeated for a long time.  Endocrinologist [place].  Last TSH was 0.39.  On levothyroxine 125.  Was lowered from 150.  Discussed needs wait about 6 weeks and have labs  again.      09/06/2022:  57 year old male returns for follow-up.  States he feels really tired.  Needs to have thyroid labs.    Allergies:  Allergies   Allergen Reactions    Penicillins      Medications:    Outpatient Medications Marked as Taking for the 09/06/22 encounter (Office Visit) with Marcelle Overlie, MD   Medication Sig    famotidine (PEPCID) 40 mg Oral Tablet Take 1 Tablet (40 mg total) by mouth Every evening    gabapentin (NEURONTIN) 300 mg Oral Capsule Take 1 Capsule (300 mg total) by mouth Twice daily    levothyroxine (SYNTHROID) 137 mcg Oral Tablet TAKE 1 TABLET BY MOUTH ONCE DAILY IN THE MORNING    linaCLOtide (LINZESS) 290 mcg Oral Capsule Take 1 Capsule (290 mcg total) by mouth Once a day    losartan (COZAAR) 25 mg Oral Tablet Take 1.5 Tablets (37.5 mg total) by mouth Once a day    MYRBETRIQ 50 mg Oral Tablet Sustained Release 24 hr Take 1 Tablet (50 mg total) by mouth Once a day    omeprazole (PRILOSEC) 40 mg Oral Capsule, Delayed Release(E.C.) Take 1 Capsule (40 mg total) by mouth Once a day    rosuvastatin (CRESTOR) 10 mg Oral Tablet Take 1 Tablet (10 mg total) by mouth Once a day    tamsulosin (FLOMAX) 0.4 mg Oral Capsule Take 1  Capsule (0.4 mg total) by mouth Once a day    vibegron (GEMTESA) 75 mg Oral Tablet Take 1 Tablet (75 mg total) by mouth Once a day     Past Medical History:  Past Medical History:   Diagnosis Date    Anxiety     Esophageal reflux     Hypercholesterolemia     Hypertension     Hypothyroidism     Rheumatoid arthritis (CMS HCC)      Past Surgical History:   Procedure Laterality Date    HX APPENDECTOMY      HX THYROIDECTOMY      HX TONSILLECTOMY      HX WISDOM TEETH EXTRACTION      SPINE SURGERY       Social History:    Social History     Socioeconomic History    Marital status: Married   Tobacco Use    Smoking status: Never     Passive exposure: Never    Smokeless tobacco: Never   Vaping Use    Vaping status: Never Used   Substance and Sexual Activity    Alcohol use: Never     Drug use: Never    Sexual activity: Yes     Partners: Female     Social Determinants of Health     Financial Resource Strain: Low Risk  (09/06/2022)    Financial Resource Strain     SDOH Financial: No   Transportation Needs: Low Risk  (09/06/2022)    Transportation Needs     SDOH Transportation: No   Social Connections: Low Risk  (09/06/2022)    Social Connections     SDOH Social Isolation: 5 or more times a week   Intimate Partner Violence: Low Risk  (09/06/2022)    Intimate Partner Violence     SDOH Domestic Violence: No   Housing Stability: Low Risk  (09/06/2022)    Housing Stability     SDOH Housing Situation: I have housing.     SDOH Housing Worry: No     Family History:  Family Medical History:       Problem Relation (Age of Onset)    Alzheimer's/Dementia Mother    Blood Clots Father    Cancer Father, Brother    Hypertension (High Blood Pressure) Father    Stroke Sister, Brother          Review of Systems:   Constitutional: negative for fevers, chills, weight changes  HEENT: negative for acute vision changes or URI symptoms  Neck:  negative dysphonia, dysphagia or neck pain  Respiratory: negative for cough, sputum, dyspnea on exertion  Cardiovascular: negative for chest discomfort, palpitations  Gastrointestinal: negative for nausea, pain, stool changes  Genitourinary: negative for frequency, dysuria, nocturia, hesitancy  Hematologic/lymphatic: negative for changes in bleeding or bruisability  Musculoskeletal:negative for new myalgisa, arthralgia, and muscle weakness  Neurological: negative for new headaches, vision changes, numbness, weakness, tingling  Psychiatric: mood stable, cognition stable  Endocrine:  As per HPI.    Objective:     Vital Signs:  BP 138/87   Pulse (!) 104   Temp 36.3 C (97.3 F) (Temporal)   Resp 17   Ht 1.727 m (5' 7.99")   Wt 102 kg (224 lb)   SpO2 91%   BMI 34.07 kg/m      Body mass index is 34.07 kg/m.    Examination:  General:  Age-appropriate, no acute distress  HEENT:  Eyes  clear.  Extraocular movements intact. Moist mucous membranes.  Neck:  thyroid is surgically absent  Cardiac:  Regular rate and rhythm, no gallops, murmurs or rubs.  Pulmonary:  Clear to ausculation bilaterally.  Pulmonary effort normal.  Gastrointestinal:  Nontender, nondistended, bowel sounds positive.    Musculoskeletal:  No deformity or lesions.  Skin:  Warm and of normal moisture.  Neurologic:  Alert and oriented.  Nonfocal.  No tremor.  Gait normal.  Psychiatric:  Affect normal.    Diagnostic Review:      No results found for: "HA1C"  No results found for: "TSH", "T3UP", "MTUP1", "FREET4", "T3", "THYSTIMUNQNT", "THYSTIMMUNOG"  No results found for: "TSH", "TSHCASCADE", "T4", "T4FREE", "T3", "FREET3"    Sheryle Hail, MD  Endocrinology, Diabetes and Metabolism    This note may have been partially generated using MModal Fluency Direct system, and there may be some incorrect words, spellings, and punctuation that were not noted in checking the note before saving.

## 2022-09-07 ENCOUNTER — Other Ambulatory Visit (INDEPENDENT_AMBULATORY_CARE_PROVIDER_SITE_OTHER): Payer: Self-pay | Admitting: Urology

## 2022-09-07 ENCOUNTER — Inpatient Hospital Stay (HOSPITAL_COMMUNITY)
Admission: RE | Admit: 2022-09-07 | Discharge: 2022-09-07 | Disposition: A | Discharge status: Home / Self Care | Payer: 59 | Source: Ambulatory Visit | Attending: Urology | Admitting: Urology

## 2022-09-07 ENCOUNTER — Ambulatory Visit: Payer: 59 | Attending: Urology | Admitting: Urology

## 2022-09-07 ENCOUNTER — Telehealth (INDEPENDENT_AMBULATORY_CARE_PROVIDER_SITE_OTHER): Payer: Self-pay | Admitting: Urology

## 2022-09-07 DIAGNOSIS — N5089 Other specified disorders of the male genital organs: Secondary | ICD-10-CM | POA: Insufficient documentation

## 2022-09-07 DIAGNOSIS — N433 Hydrocele, unspecified: Secondary | ICD-10-CM | POA: Insufficient documentation

## 2022-09-07 DIAGNOSIS — I861 Scrotal varices: Secondary | ICD-10-CM | POA: Insufficient documentation

## 2022-09-07 NOTE — Progress Notes (Signed)
[facility]  [address]  [address]  [place] [address]  [phone]   Urology Progress Note    Date of Service:  09/07/2022  Roy Spearman., 57 y.o. male  Date of Birth:  1965/09/09  PCP: Brunetta Genera, FNP  Referring:  No ref. provider found     HPI:  Roy Rostkowski. is a 57 y.o. White male who has a mild right hydrocele that he was concerned about.  We went ahead with a scrotal ultrasound today and he does have a moderate right hydrocele and has a little bit of hydrocele fluid unless side.  On exam he has some mild hydrocele in the right side but it has not very significant and certainly is not bothering him.  He was reassured.  Clearly surgical therapy is not necessary at this time.  I have warned him that it may get larger in the future and if that happens we can reconsider treatment at that time.    Past Medical History:   Diagnosis Date    Anxiety     Esophageal reflux     Hypercholesterolemia     Hypertension     Hypothyroidism     Rheumatoid arthritis (CMS HCC)       Past Surgical History:   Procedure Laterality Date    HX APPENDECTOMY      HX THYROIDECTOMY      HX TONSILLECTOMY      HX WISDOM TEETH EXTRACTION      SPINE SURGERY        Social History     Tobacco Use    Smoking status: Never     Passive exposure: Never    Smokeless tobacco: Never   Vaping Use    Vaping status: Never Used   Substance Use Topics    Alcohol use: Never    Drug use: Never       Family Medical History:       Problem Relation (Age of Onset)    Alzheimer's/Dementia Mother    Blood Clots Father    Cancer Father, Brother    Hypertension (High Blood Pressure) Father    Stroke Sister, Brother           Outpatient Medications Marked as Taking for the 09/07/22 encounter (Office Visit) with Girtha Hake, MD   Medication Sig    levothyroxine (SYNTHROID) 137 mcg Oral Tablet TAKE 1 TABLET BY MOUTH ONCE DAILY IN THE MORNING    linaCLOtide  (LINZESS) 290 mcg Oral Capsule Take 1 Capsule (290 mcg total) by mouth Once a day    losartan (COZAAR) 25 mg Oral Tablet Take 1.5 Tablets (37.5 mg total) by mouth Once a day    MYRBETRIQ 50 mg Oral Tablet Sustained Release 24 hr Take 1 Tablet (50 mg total) by mouth Once a day    omeprazole (PRILOSEC) 40 mg Oral Capsule, Delayed Release(E.C.) Take 1 Capsule (40 mg total) by mouth Once a day    tamsulosin (FLOMAX) 0.4 mg Oral Capsule Take 1 Capsule (0.4 mg total) by mouth Once a day      Allergies   Allergen Reactions    Penicillins         Review of Systems   Constitutional:  Negative for chills, fever and malaise/fatigue.   Eyes:  Negative for blurred vision.   Respiratory:  Negative for shortness of breath and wheezing.    Cardiovascular:  Negative for chest pain.  Gastrointestinal:  Negative for abdominal pain, nausea and vomiting.   Genitourinary:  Negative for dysuria, flank pain, frequency, hematuria and urgency.        Right scrotal swelling   Musculoskeletal:  Negative for back pain.   Skin: Negative.    Neurological:  Negative for weakness.   Psychiatric/Behavioral:  Negative for memory loss.           There were no vitals taken for this visit.         Physical Exam  Vitals and nursing note reviewed.   Eyes:      Conjunctiva/sclera: Conjunctivae normal.   Pulmonary:      Effort: Pulmonary effort is normal. No respiratory distress.      Breath sounds: Normal breath sounds. No wheezing or rales.   Chest:      Chest wall: No tenderness.   Abdominal:      General: Bowel sounds are normal. There is no distension.      Palpations: Abdomen is soft. There is no mass.      Tenderness: There is no abdominal tenderness. There is no guarding or rebound.      Comments: Negative for flank pain.     Genitourinary:     Comments: Mild right hydrocele  Musculoskeletal:      Cervical back: Normal range of motion.      Comments: Negative for low back pain.     Skin:     General: Skin is warm and dry.   Neurological:       Mental Status: He is alert and oriented to person, place, and time.   Psychiatric:         Mood and Affect: Mood and affect normal.          Data Review:  Pertinent laboratory data and imaging studies reviewed.          [facility]  [address]  [facility] [address]    Procedure Note    Name: Roy Boyd. MRN:  [id number]   Date: 09/07/2022 DOB:  21-Jul-1965 (57 y.o.)         81001 - URINALYSIS, AUTOMATED W/ MICROSCOPY (AMB ONLY)    Performed by: Girtha Hake, MD  Authorized by: Girtha Hake, MD    Documentation:       Urine is clear under the microscope  No red cells or white cells seen  Creatinine 300        Girtha Hake, MD    Assessment  Assessment/Plan   1. Right hydrocele       Mild right hydrocele    Plan:  P.r.n.    Return if symptoms worsen or fail to improve.     Patient was seen and evaluated and findings were communicated with referring physician.    Girtha Hake, MD

## 2022-09-07 NOTE — Procedures (Signed)
[facility]  [address]  [facility] [address]    Procedure Note    Name: Roy Boyd. MRN:  [id number]   Date: 09/07/2022 DOB:  29-Jan-1966 (57 y.o.)         81001 - URINALYSIS, AUTOMATED W/ MICROSCOPY (AMB ONLY)    Performed by: Girtha Hake, MD  Authorized by: Girtha Hake, MD    Documentation:       Urine is clear under the microscope  No red cells or white cells seen  Creatinine 300        Girtha Hake, MD

## 2022-09-08 LAB — THYROGLOBULIN, 2ND GEN: THYROGLOBULIN, 2ND GEN: <0.1 ng/mL

## 2022-09-08 LAB — THYROGLOBULIN, TUMOR MARKER REFLEX TO LC-MS/MS OR IMMUNOASSAY: THYROGLOBULIN ANTIBODY: <1 IU/mL (ref <=1)

## 2022-09-11 ENCOUNTER — Encounter (RURAL_HEALTH_CENTER): Payer: Self-pay | Admitting: INTERNAL MEDICINE-ENDOCRINOLOGY-DIABETES AND METABOLISM

## 2022-09-20 ENCOUNTER — Telehealth (RURAL_HEALTH_CENTER): Payer: Self-pay | Admitting: INTERNAL MEDICINE-ENDOCRINOLOGY-DIABETES AND METABOLISM

## 2022-09-20 NOTE — Telephone Encounter (Signed)
From: Marcelle Overlie, MD   Sent: 09/11/2022   To: [name].   Subject: Unread Message Notification                      [place],     Your labs look good. Your blood marker for thyroid cancer is negative.  Normal thyroid hormone, iron, B12, electrolytes, and vitamin D. I recommend no changes.     Sheryle Hail, MD   Endocrinology, Diabetes and Metabolism           Patient's spouse notified.bnr,ma

## 2022-09-29 ENCOUNTER — Other Ambulatory Visit (HOSPITAL_COMMUNITY): Payer: Self-pay | Admitting: Interventional Cardiology

## 2022-09-29 DIAGNOSIS — R0600 Dyspnea, unspecified: Secondary | ICD-10-CM

## 2022-10-02 ENCOUNTER — Encounter (INDEPENDENT_AMBULATORY_CARE_PROVIDER_SITE_OTHER): Payer: Self-pay

## 2022-10-10 ENCOUNTER — Other Ambulatory Visit: Payer: Self-pay

## 2022-10-10 ENCOUNTER — Ambulatory Visit: Payer: 59 | Attending: Urology | Admitting: Urology

## 2022-10-10 ENCOUNTER — Encounter (INDEPENDENT_AMBULATORY_CARE_PROVIDER_SITE_OTHER): Payer: Self-pay | Admitting: Urology

## 2022-10-10 DIAGNOSIS — N400 Enlarged prostate without lower urinary tract symptoms: Secondary | ICD-10-CM | POA: Insufficient documentation

## 2022-10-10 DIAGNOSIS — R399 Unspecified symptoms and signs involving the genitourinary system: Secondary | ICD-10-CM | POA: Insufficient documentation

## 2022-10-10 MED ORDER — MYRBETRIQ 50 MG TABLET,EXTENDED RELEASE
1.0000 | ORAL_TABLET | Freq: Every day | ORAL | 3 refills | Status: DC
Start: 2022-10-10 — End: 2023-05-24

## 2022-10-10 MED ORDER — TAMSULOSIN 0.4 MG CAPSULE
0.4000 mg | ORAL_CAPSULE | Freq: Every day | ORAL | 3 refills | Status: DC
Start: 2022-10-10 — End: 2023-05-24

## 2022-10-10 NOTE — Progress Notes (Signed)
[facility]  [address]  [address]  [place] [address]  [phone]   Urology Progress Note    Date of Service:  10/10/2022  Roy Boyd., 57 y.o. male  Date of Birth:  1965/11/14  PCP: Brunetta Genera, FNP  Referring:  No ref. provider found     HPI:  Roy Boyd. is a 57 y.o. White male who continues to do well with his LUTS symptoms on a combination of Myrbetriq and Flomax therapy.  His symptoms doing well BladderScan is absolutely 0.  Were going to go ahead and continue the Myrbetriq and Flomax therapy and we will re-scanned him next year.    Past Medical History:   Diagnosis Date    Anxiety     Esophageal reflux     Hypercholesterolemia     Hypertension     Hypothyroidism     Rheumatoid arthritis (CMS HCC)       Past Surgical History:   Procedure Laterality Date    HX APPENDECTOMY      HX THYROIDECTOMY      HX TONSILLECTOMY      HX WISDOM TEETH EXTRACTION      SPINE SURGERY        Social History     Tobacco Use    Smoking status: Never     Passive exposure: Never    Smokeless tobacco: Never   Vaping Use    Vaping status: Never Used   Substance Use Topics    Alcohol use: Never    Drug use: Never       Family Medical History:       Problem Relation (Age of Onset)    Alzheimer's/Dementia Mother    Blood Clots Father    Cancer Father, Brother    Hypertension (High Blood Pressure) Father    Stroke Sister, Brother           Outpatient Medications Marked as Taking for the 10/10/22 encounter (Office Visit) with Girtha Hake, MD   Medication Sig    levothyroxine (SYNTHROID) 137 mcg Oral Tablet TAKE 1 TABLET BY MOUTH ONCE DAILY IN THE MORNING    linaCLOtide (LINZESS) 290 mcg Oral Capsule Take 1 Capsule (290 mcg total) by mouth Once a day    losartan (COZAAR) 25 mg Oral Tablet Take 1.5 Tablets (37.5 mg total) by mouth Once a day    MYRBETRIQ 50 mg Oral Tablet Sustained Release 24 hr Take 1 Tablet (50 mg total) by mouth  Once a day    omeprazole (PRILOSEC) 40 mg Oral Capsule, Delayed Release(E.C.) Take 1 Capsule (40 mg total) by mouth Once a day    tamsulosin (FLOMAX) 0.4 mg Oral Capsule Take 1 Capsule (0.4 mg total) by mouth Once a day      Allergies   Allergen Reactions    Penicillins         Review of Systems   Constitutional:  Negative for chills, fever and malaise/fatigue.   Eyes:  Negative for blurred vision.   Respiratory:  Negative for shortness of breath and wheezing.    Cardiovascular:  Negative for chest pain.   Gastrointestinal:  Negative for abdominal pain, nausea and vomiting.   Genitourinary:  Negative for dysuria, flank pain, frequency, hematuria and urgency.   Musculoskeletal:  Negative for back pain.   Skin: Negative.    Neurological:  Negative for weakness.   Psychiatric/Behavioral:  Negative for memory loss.  There were no vitals taken for this visit.         Physical Exam  Vitals and nursing note reviewed.   Eyes:      Conjunctiva/sclera: Conjunctivae normal.   Pulmonary:      Effort: Pulmonary effort is normal. No respiratory distress.      Breath sounds: Normal breath sounds. No wheezing or rales.   Chest:      Chest wall: No tenderness.   Abdominal:      General: Bowel sounds are normal. There is no distension.      Palpations: Abdomen is soft. There is no mass.      Tenderness: There is no abdominal tenderness. There is no guarding or rebound.      Comments: Negative for flank pain.     Musculoskeletal:      Cervical back: Normal range of motion.      Comments: Negative for low back pain.     Skin:     General: Skin is warm and dry.   Neurological:      Mental Status: He is alert and oriented to person, place, and time.   Psychiatric:         Mood and Affect: Mood and affect normal.          Data Review:  Pertinent laboratory data and imaging studies reviewed.               Assessment  Assessment/Plan   1. Lower urinary tract symptoms (LUTS)    2. BPH (benign prostatic hyperplasia)       LUTS  symptoms doing well on a combination Myrbetriq and Flomax therapy   BladderScan 0    Plan:  Continue Flomax and Myrbetriq 50 mg  BladderScan 1 year    Return in about 1 year (around 10/10/2023).     Patient was seen and evaluated and findings were communicated with referring physician.    Girtha Hake, MD

## 2022-10-10 NOTE — Nursing Note (Signed)
Bladder scan 0ml

## 2022-10-19 ENCOUNTER — Inpatient Hospital Stay
Admission: RE | Admit: 2022-10-19 | Discharge: 2022-10-19 | Disposition: A | Discharge status: Home / Self Care | Payer: 59 | Source: Ambulatory Visit | Attending: Interventional Cardiology | Admitting: Interventional Cardiology

## 2022-10-19 ENCOUNTER — Other Ambulatory Visit: Payer: Self-pay

## 2022-10-19 DIAGNOSIS — R0600 Dyspnea, unspecified: Secondary | ICD-10-CM | POA: Insufficient documentation

## 2022-10-24 ENCOUNTER — Ambulatory Visit (INDEPENDENT_AMBULATORY_CARE_PROVIDER_SITE_OTHER): Payer: Self-pay

## 2022-10-31 ENCOUNTER — Other Ambulatory Visit (RURAL_HEALTH_CENTER): Payer: Self-pay | Admitting: INTERNAL MEDICINE-ENDOCRINOLOGY-DIABETES AND METABOLISM

## 2022-10-31 DIAGNOSIS — C73 Malignant neoplasm of thyroid gland: Secondary | ICD-10-CM

## 2022-10-31 NOTE — Telephone Encounter (Signed)
Pharmacy requested refill.bnr,ma

## 2022-11-28 LAB — POCT HGB A1C: POCT HGB A1C: 5.8 % (ref 4–6)

## 2022-11-28 LAB — HM MICROALB/CR RATIO
ALBUMIN/CREATININE RATIO, RANDOM URINE: 14
ALBUMIN: 32.1
CREATININE, RANDOM, U: 233.1

## 2022-11-28 LAB — HM EGFR: GFR: 81 mL/min/{1.73_m2}

## 2022-12-28 ENCOUNTER — Other Ambulatory Visit (RURAL_HEALTH_CENTER): Payer: Self-pay

## 2023-01-03 ENCOUNTER — Other Ambulatory Visit (RURAL_HEALTH_CENTER): Payer: Self-pay | Admitting: INTERNAL MEDICINE-ENDOCRINOLOGY-DIABETES AND METABOLISM

## 2023-01-03 ENCOUNTER — Telehealth (RURAL_HEALTH_CENTER): Payer: Self-pay | Admitting: INTERNAL MEDICINE-ENDOCRINOLOGY-DIABETES AND METABOLISM

## 2023-01-03 DIAGNOSIS — C73 Malignant neoplasm of thyroid gland: Secondary | ICD-10-CM

## 2023-01-03 MED ORDER — LEVOTHYROXINE 150 MCG TABLET
150.0000 ug | ORAL_TABLET | Freq: Every morning | ORAL | 4 refills | Status: AC
Start: 2023-01-03 — End: ?

## 2023-01-03 NOTE — Telephone Encounter (Signed)
Roy Overlie, MD  [name], [name]  TSH is 7.  Need to increase levothyroxine from 137-150.  I sent an order for that and also mail her a lab order for TSH to get down in 6-8 weeks          Patient notified and lab orders mailed out.bnr,ma

## 2023-01-24 ENCOUNTER — Other Ambulatory Visit (RURAL_HEALTH_CENTER): Payer: Self-pay | Admitting: INTERNAL MEDICINE-ENDOCRINOLOGY-DIABETES AND METABOLISM

## 2023-01-24 DIAGNOSIS — C73 Malignant neoplasm of thyroid gland: Secondary | ICD-10-CM

## 2023-01-24 MED ORDER — LEVOTHYROXINE 150 MCG TABLET
150.0000 ug | ORAL_TABLET | Freq: Every morning | ORAL | 1 refills | Status: DC
Start: 2023-01-24 — End: 2023-03-13

## 2023-01-24 NOTE — Telephone Encounter (Signed)
Pharmacy faxed refill request. Nmt, lpn

## 2023-03-12 ENCOUNTER — Other Ambulatory Visit (RURAL_HEALTH_CENTER): Payer: Self-pay | Admitting: INTERNAL MEDICINE-ENDOCRINOLOGY-DIABETES AND METABOLISM

## 2023-03-13 ENCOUNTER — Ambulatory Visit
Payer: 59 | Attending: INTERNAL MEDICINE-ENDOCRINOLOGY-DIABETES AND METABOLISM | Admitting: INTERNAL MEDICINE-ENDOCRINOLOGY-DIABETES AND METABOLISM

## 2023-03-13 ENCOUNTER — Encounter (RURAL_HEALTH_CENTER): Payer: Self-pay | Admitting: INTERNAL MEDICINE-ENDOCRINOLOGY-DIABETES AND METABOLISM

## 2023-03-13 ENCOUNTER — Other Ambulatory Visit: Payer: Self-pay

## 2023-03-13 VITALS — BP 152/84 | HR 51 | Temp 97.5°F | Resp 17 | Ht 67.99 in | Wt 226.6 lb

## 2023-03-13 DIAGNOSIS — Z9889 Other specified postprocedural states: Secondary | ICD-10-CM | POA: Insufficient documentation

## 2023-03-13 DIAGNOSIS — Z6834 Body mass index (BMI) 34.0-34.9, adult: Secondary | ICD-10-CM | POA: Insufficient documentation

## 2023-03-13 DIAGNOSIS — C73 Malignant neoplasm of thyroid gland: Secondary | ICD-10-CM | POA: Insufficient documentation

## 2023-03-13 DIAGNOSIS — E890 Postprocedural hypothyroidism: Secondary | ICD-10-CM | POA: Insufficient documentation

## 2023-03-13 DIAGNOSIS — Z85850 Personal history of malignant neoplasm of thyroid: Secondary | ICD-10-CM | POA: Insufficient documentation

## 2023-03-13 DIAGNOSIS — R635 Abnormal weight gain: Secondary | ICD-10-CM | POA: Insufficient documentation

## 2023-03-13 DIAGNOSIS — Z79890 Hormone replacement therapy (postmenopausal): Secondary | ICD-10-CM | POA: Insufficient documentation

## 2023-03-13 MED ORDER — LEVOTHYROXINE 175 MCG TABLET
175.0000 ug | ORAL_TABLET | Freq: Every morning | ORAL | 0 refills | Status: DC
Start: 2023-03-13 — End: 2023-04-19

## 2023-03-13 NOTE — Progress Notes (Signed)
Family Medicine, Ambulatory [facility]  [address]  [place] [address]  [phone]    Date: 03/13/2023  Patient Name:  Roy Boyd.   Age: 57 y.o.  Attending: Sheryle Hail, MD - Endocrinology, Diabetes and Metabolism  PCP: Brunetta Genera, FNP    I have personally reviewed allergies, chief complaint, medications, past medical history, surgical history, and family history.  I have reviewed referral notes, PCP progress notes and labs and images relevant to this visit.      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  Recommend increase dose to 175  - no further cancer surveillance needed at this point    3. History of C spine surgery with hardware placement  - severe physical limitations given surgery, but states he was going to be paralyzed otherwise    No follow-ups on file.    Chief Complaint:    Chief Complaint   Patient presents with    Follow Up 6 Months     History of Present Illness:   57 year old male presents for evaluation and management of hypothyroidism.  Has a history of thyroid cancer, status post total thyroidectomy 1995.  Had radioactive iodine.  Followed with an endocrinologist and states that he had to have his dose adjusted about twice a year.  He is taking his medication consistently and correctly.  States he had some thyroid tissue or a nodule that had grown back in the thyroid bed but his endocrinologist told him it was not concerning and he has not had repeated for a long time.  Endocrinologist [place] of [place].  Last TSH was 0.39.  On levothyroxine 125.  Was lowered from 150.  Discussed needs  wait about 6 weeks and have labs again.      09/06/2022:  57 year old male returns for follow-up.  States he feels really tired.  Needs to have thyroid labs.    03/13/2023:  58 year old male returns for follow-up.  Still with fatigue.  He is largely sedentary due to C-spine surgery and physical limitations from met.  Most of his fatigue is start after he stopped working.  He has also gained weight.  We went up on thyroid hormone replacement his TSH was 2.2.    Allergies:  Allergies   Allergen Reactions    Penicillins      Medications:    Outpatient Medications Marked as Taking for the 03/13/23 encounter (Office Visit) with Marcelle Overlie, MD   Medication Sig    baclofen (LIORESAL) 10 mg Oral Tablet Take 1 Tablet (10 mg total) by mouth Three times a day    famotidine (PEPCID) 40 mg Oral Tablet Take 1 Tablet (40 mg total) by mouth Every evening    gabapentin (NEURONTIN) 300 mg Oral Capsule Take 1 Capsule (300 mg total) by mouth Twice daily    levothyroxine (SYNTHROID) 150 mcg Oral Tablet Take 1 Tablet (150 mcg total) by mouth Every morning    linaCLOtide (LINZESS) 290 mcg Oral Capsule Take 1 Capsule (290 mcg total) by mouth Once a day    losartan (COZAAR) 25 mg Oral Tablet Take  1.5 Tablets (37.5 mg total) by mouth Once a day    MYRBETRIQ 50 mg Oral Tablet Sustained Release 24 hr Take 1 Tablet (50 mg total) by mouth Once a day    omeprazole (PRILOSEC) 40 mg Oral Capsule, Delayed Release(E.C.) Take 1 Capsule (40 mg total) by mouth Once a day    rosuvastatin (CRESTOR) 10 mg Oral Tablet Take 1 Tablet (10 mg total) by mouth Once a day    tamsulosin (FLOMAX) 0.4 mg Oral Capsule Take 1 Capsule (0.4 mg total) by mouth Once a day    vibegron (GEMTESA) 75 mg Oral Tablet Take 1 Tablet (75 mg total) by mouth Once a day     Past Medical History:  Past Medical History:   Diagnosis Date    Anxiety     Esophageal reflux     Hypercholesterolemia     Hypertension     Hypothyroidism     Rheumatoid arthritis (CMS HCC)      Past Surgical  History:   Procedure Laterality Date    HX APPENDECTOMY      HX THYROIDECTOMY      HX TONSILLECTOMY      HX WISDOM TEETH EXTRACTION      SPINE SURGERY       Social History:    Social History     Socioeconomic History    Marital status: Married   Tobacco Use    Smoking status: Never     Passive exposure: Never    Smokeless tobacco: Never   Vaping Use    Vaping status: Never Used   Substance and Sexual Activity    Alcohol use: Never    Drug use: Never    Sexual activity: Yes     Partners: Female     Social Determinants of Health     Financial Resource Strain: Low Risk  (09/06/2022)    Financial Resource Strain     SDOH Financial: No   Transportation Needs: Low Risk  (09/06/2022)    Transportation Needs     SDOH Transportation: No   Social Connections: Low Risk  (09/06/2022)    Social Connections     SDOH Social Isolation: 5 or more times a week   Intimate Partner Violence: Low Risk  (09/06/2022)    Intimate Partner Violence     SDOH Domestic Violence: No   Housing Stability: Low Risk  (09/06/2022)    Housing Stability     SDOH Housing Situation: I have housing.     SDOH Housing Worry: No     Family History:  Family Medical History:       Problem Relation (Age of Onset)    Alzheimer's/Dementia Mother    Blood Clots Father    Cancer Father, Brother    Hypertension (High Blood Pressure) Father    Stroke Sister, Brother          Review of Systems:   Constitutional: negative for fevers, chills, weight changes  HEENT: negative for acute vision changes or URI symptoms  Neck:  negative dysphonia, dysphagia or neck pain  Respiratory: negative for cough, sputum, dyspnea on exertion  Cardiovascular: negative for chest discomfort, palpitations  Gastrointestinal: negative for nausea, pain, stool changes  Genitourinary: negative for frequency, dysuria, nocturia, hesitancy  Hematologic/lymphatic: negative for changes in bleeding or bruisability  Musculoskeletal:negative for new myalgisa, arthralgia, and muscle weakness  Neurological:  negative for new headaches, vision changes, numbness, weakness, tingling  Psychiatric: mood stable, cognition stable  Endocrine:  As per HPI.  Objective:     Vital Signs:  BP (!) 152/84   Pulse 51   Temp 36.4 C (97.5 F) (Temporal)   Resp 17   Ht 1.727 m (5' 7.99")   Wt 103 kg (226 lb 9.6 oz)   SpO2 96%   BMI 34.46 kg/m      Body mass index is 34.46 kg/m.    Examination:  General:  Age-appropriate, no acute distress  HEENT:  Eyes clear.  Extraocular movements intact. Moist mucous membranes.  Neck:  thyroid is surgically absent  Cardiac:  Regular rate and rhythm, no gallops, murmurs or rubs.  Pulmonary:  Clear to ausculation bilaterally.  Pulmonary effort normal.  Gastrointestinal:  Nontender, nondistended, bowel sounds positive.    Musculoskeletal:  No deformity or lesions.  Skin:  Warm and of normal moisture.  Neurologic:  Alert and oriented.  Nonfocal.  No tremor.  Gait normal.  Psychiatric:  Affect normal.    Diagnostic Review:  Last BMP  (Last result in the past 2 years)        Na   K   Cl   CO2   BUN   Cr   Calcium   Glucose   Glucose-Fasting        09/06/22 1055 138   3.9   108   24   20   0.96   9.4  Comment: Gadolinium-containing contrast can interfere with calcium measurement.     119               Last Hepatic Panel  (Last result in the past 2 years)        Albumin   Total PTN   Total Bili   Direct Bili   Ast/SGOT   Alt/SGPT   Alk Phos        11/28/22 1318 32.1                         Last BMP  (Last result in the past 2 years)        Na   K   Cl   CO2   BUN   Cr   Calcium   Glucose   Glucose-Fasting        09/06/22 1055 138   3.9   108   24   20   0.96   9.4  Comment: Gadolinium-containing contrast can interfere with calcium measurement.     119               No results found for: "HA1C"  Lab Results   Component Value Date    TSH 2.609 09/06/2022    FREET4 1.13 09/06/2022     TSH (uIU/mL)   Date Value   09/06/2022 2.609     Sheryle Hail, MD  Endocrinology, Diabetes and Metabolism  This note  may have been partially generated using MModal Fluency Direct system, and there may be some incorrect words, spellings, and punctuation that were not noted in checking the note before saving.

## 2023-03-19 ENCOUNTER — Telehealth (RURAL_HEALTH_CENTER): Payer: Self-pay | Admitting: INTERNAL MEDICINE-ENDOCRINOLOGY-DIABETES AND METABOLISM

## 2023-03-19 NOTE — Telephone Encounter (Signed)
Marcelle Overlie, MD  [name], [place], [place]  TSH is in good range, it is less than 2.5 which is the goal with thyroid cancer history          Patient notified.bnr,ma

## 2023-04-19 ENCOUNTER — Other Ambulatory Visit (RURAL_HEALTH_CENTER): Payer: Self-pay | Admitting: INTERNAL MEDICINE-ENDOCRINOLOGY-DIABETES AND METABOLISM

## 2023-04-19 DIAGNOSIS — C73 Malignant neoplasm of thyroid gland: Secondary | ICD-10-CM

## 2023-04-19 NOTE — Telephone Encounter (Signed)
Pharmacy faxed refill request. Nmt, lpn

## 2023-04-20 ENCOUNTER — Ambulatory Visit (INDEPENDENT_AMBULATORY_CARE_PROVIDER_SITE_OTHER): Payer: Self-pay | Admitting: Urology

## 2023-04-20 DIAGNOSIS — N400 Enlarged prostate without lower urinary tract symptoms: Secondary | ICD-10-CM

## 2023-05-10 ENCOUNTER — Ambulatory Visit (INDEPENDENT_AMBULATORY_CARE_PROVIDER_SITE_OTHER): Payer: Self-pay | Admitting: Urology

## 2023-05-10 DIAGNOSIS — N400 Enlarged prostate without lower urinary tract symptoms: Secondary | ICD-10-CM

## 2023-05-22 ENCOUNTER — Other Ambulatory Visit (INDEPENDENT_AMBULATORY_CARE_PROVIDER_SITE_OTHER): Payer: Self-pay | Admitting: INTERNAL MEDICINE-ENDOCRINOLOGY-DIABETES AND METABOLISM

## 2023-05-22 MED ORDER — LEVOTHYROXINE 200 MCG TABLET
200.0000 ug | ORAL_TABLET | Freq: Every morning | ORAL | 0 refills | Status: DC
Start: 2023-05-22 — End: 2023-08-08

## 2023-05-23 ENCOUNTER — Telehealth (INDEPENDENT_AMBULATORY_CARE_PROVIDER_SITE_OTHER): Payer: Self-pay | Admitting: INTERNAL MEDICINE-ENDOCRINOLOGY-DIABETES AND METABOLISM

## 2023-05-23 NOTE — Telephone Encounter (Signed)
Marcelle Overlie, MD  Tomasa Rand, LPN  TSH is still too high.    Increase levothyroxine from daily to daily. Script sent                 Patient notified.bnr,ma

## 2023-05-24 ENCOUNTER — Encounter (INDEPENDENT_AMBULATORY_CARE_PROVIDER_SITE_OTHER): Payer: Self-pay | Admitting: Urology

## 2023-05-24 ENCOUNTER — Ambulatory Visit: Payer: 59 | Attending: Urology | Admitting: Urology

## 2023-05-24 ENCOUNTER — Other Ambulatory Visit: Payer: Self-pay

## 2023-05-24 VITALS — BP 148/88 | HR 100 | Ht 67.0 in | Wt 226.0 lb

## 2023-05-24 DIAGNOSIS — N3941 Urge incontinence: Secondary | ICD-10-CM

## 2023-05-24 DIAGNOSIS — N411 Chronic prostatitis: Secondary | ICD-10-CM | POA: Insufficient documentation

## 2023-05-24 DIAGNOSIS — N400 Enlarged prostate without lower urinary tract symptoms: Secondary | ICD-10-CM | POA: Insufficient documentation

## 2023-05-24 MED ORDER — OXYBUTYNIN CHLORIDE ER 10 MG TABLET,EXTENDED RELEASE 24 HR
10.0000 mg | EXTENDED_RELEASE_TABLET | Freq: Every day | ORAL | 4 refills | Status: DC
Start: 2023-05-24 — End: 2024-03-07

## 2023-05-24 MED ORDER — TAMSULOSIN 0.4 MG CAPSULE
0.4000 mg | ORAL_CAPSULE | Freq: Two times a day (BID) | ORAL | 3 refills | Status: AC
Start: 2023-05-24 — End: 2023-09-21

## 2023-05-24 NOTE — Nursing Note (Signed)
05/24/23 0800   Urine Test   PVR Volume 17ml   [employer]

## 2023-05-24 NOTE — Patient Instructions (Signed)
STOP Myrbetriq   Start Tamsulozin twice a day

## 2023-05-24 NOTE — Progress Notes (Signed)
[facility]  Urologic Surgery Office New Patient Visit Note    Name: Roy Boyd. MRN:  [id number]   Date of Birth: 17-Dec-1965 Age: 58 y.o.   Date: 05/24/2023         PCP: Brunetta Genera, FNP    I am seeing Roy Boyd. in consultation for: BPH with nocturia    History of Present Illness:  Roy Boyd. is a 58 y.o. male who presents to the urology office for evaluation of the above chief complaint.  The patient reports following with Dr. Louanne Belton for BPH and nocturia.  Was placed on Flomax 0.4 mg q.day and Myrbetriq 50 mg q.day. he states he was on another medication that gave him side effects be can not recall that medication today.  He reports challenges with urinary urgency rare urge incontinence slow stream double voids sensation of incomplete void and nocturia 1-2 per night.  He reports no Rx side effects on Flomax or Myrbetriq on review.  The patient denies any history of gross hematuria, recurrent urinary tract infection, STD, personal history of calculus disease, family history of calculus disease, family history of prostate cancer and no history of urologic surgery.          Past Medical History:   Diagnosis Date    Anxiety     Esophageal reflux     Hypercholesterolemia     Hypertension     Hypothyroidism     Rheumatoid arthritis (CMS HCC)       Past Surgical History:   Procedure Laterality Date    HX APPENDECTOMY      HX THYROIDECTOMY      HX TONSILLECTOMY      HX WISDOM TEETH EXTRACTION      SPINE SURGERY       Allergies   Allergen Reactions    Penicillins      Current Outpatient Medications   Medication Sig    baclofen (LIORESAL) 10 mg Oral Tablet Take 1 Tablet (10 mg total) by mouth Three times a day    famotidine (PEPCID) 40 mg Oral Tablet Take 1 Tablet (40 mg total) by mouth Every evening    gabapentin (NEURONTIN) 300 mg Oral Capsule Take 1 Capsule (300 mg total) by mouth Twice daily    levothyroxine (SYNTHROID) 200 mcg Oral Tablet Take 1 Tablet (200 mcg  total) by mouth Every morning    linaCLOtide (LINZESS) 290 mcg Oral Capsule Take 1 Capsule (290 mcg total) by mouth Once a day    losartan (COZAAR) 25 mg Oral Tablet Take 1.5 Tablets (37.5 mg total) by mouth Once a day    MYRBETRIQ 50 mg Oral Tablet Sustained Release 24 hr Take 1 Tablet (50 mg total) by mouth Once a day    omeprazole (PRILOSEC) 40 mg Oral Capsule, Delayed Release(E.C.) Take 1 Capsule (40 mg total) by mouth Once a day    rosuvastatin (CRESTOR) 10 mg Oral Tablet Take 1 Tablet (10 mg total) by mouth Once a day    tamsulosin (FLOMAX) 0.4 mg Oral Capsule Take 1 Capsule (0.4 mg total) by mouth Once a day    vibegron (GEMTESA) 75 mg Oral Tablet Take 1 Tablet (75 mg total) by mouth Once a day     Family History       Problem Relation Age of Onset Comments    Alzheimer's/Dementia Mother      Blood Clots Father      Cancer Brother  Cancer Father      Hypertension (High Blood Pressure) Father      Stroke Brother      Stroke Sister             Social History     Socioeconomic History    Marital status: Married   Tobacco Use    Smoking status: Never     Passive exposure: Never    Smokeless tobacco: Never   Vaping Use    Vaping status: Never Used   Substance and Sexual Activity    Alcohol use: Never    Drug use: Never    Sexual activity: Yes     Partners: Female     Social Determinants of Health     Financial Resource Strain: Low Risk  (09/06/2022)    Financial Resource Strain     SDOH Financial: No   Transportation Needs: Low Risk  (09/06/2022)    Transportation Needs     SDOH Transportation: No   Social Connections: Low Risk  (09/06/2022)    Social Connections     SDOH Social Isolation: 5 or more times a week   Intimate Partner Violence: Low Risk  (09/06/2022)    Intimate Partner Violence     SDOH Domestic Violence: No   Housing Stability: Low Risk  (09/06/2022)    Housing Stability     SDOH Housing Situation: I have housing.     SDOH Housing Worry: No        Review of Systems:  A 12-point extended review of  systems was performed.  All systems are negative, except for the new positives and/or changes that have been noted above in the history of present illness & interval history.    Afebrile, vital signs stable (please see rooming notes for details)    Physical Exam:      Constitutional: normal, alert, cooperative and no distress  Eyes: anicteric sclerae, extraocular eye movements intact and no ptosis  HENMT: normocephalic, atraumatic. nares are symmetric, no nasal flaring or respiratory distress. No obvious lesions or masses. No lip cyanosis.  Cardiovascular: no digital clubbing or cyanosis present, no palpable abdominal aortic aneurysm. No peripheral edema.  Pulmonary/Respiration: normal respiratory rate and non-labored, comfortable respiratory effort without recruitment of accessory respiratory muscles.   Abdominal: soft, non-distended, non-tender. No flank pain. No masses or hepatosplenomegaly.  Musculoskeletal: normal station and posture. Moves all extremities.  Neurological: cranial nerves II through XII intact  Psychiatric: alert, oriented, normal speech, no focal findings or movement disorder noted.  Exhibits appropriate insight during discussion.  Skin: normal coloration and turgor, no rashes, no suspicious skin lesions noted.  Hematological/Immunological: no ecchymosis, no petechiae, no bleeding gums and no jaundice  Lymphatic: no femoral or inguinal palpable lymphadenopathy    Diagnosis:  1. 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 Urgency with urge incontinence    Plan:  1. DC Myrbetriq  2. Continue Flomax 0.4 mg q.day and increase to b.i.d.   3. E prescribe oxybutynin 10 mg 1 p.o. q.a.m. with instructions and precautions   4. Bladder scan post-void residual volume=40mL today.  5. Begin bladder irritants elimination diet  6. Start fluid restriction 2.5 hrs before hs and voiding before bed   7. Return to clinic in 8 weeks with bladder scan PVR     Doralee Albino. Naarah Borgerding, MD   [facility]rology

## 2023-06-13 ENCOUNTER — Ambulatory Visit (INDEPENDENT_AMBULATORY_CARE_PROVIDER_SITE_OTHER): Payer: Self-pay | Admitting: INTERNAL MEDICINE-ENDOCRINOLOGY-DIABETES AND METABOLISM

## 2023-07-19 ENCOUNTER — Ambulatory Visit (INDEPENDENT_AMBULATORY_CARE_PROVIDER_SITE_OTHER): Payer: Self-pay | Admitting: Urology

## 2023-07-26 ENCOUNTER — Ambulatory Visit (INDEPENDENT_AMBULATORY_CARE_PROVIDER_SITE_OTHER): Payer: Self-pay | Admitting: Urology

## 2023-07-26 DIAGNOSIS — N138 Other obstructive and reflux uropathy: Secondary | ICD-10-CM

## 2023-07-26 NOTE — Progress Notes (Unsigned)
 [place]  [facility]  Urologic Surgery Office Follow-Up Patient Visit Note    Name: Roy Boyd. MRN:  [id number]   Date of Birth: [birth year] Age: 58 y.o.   Date: 07/26/2023         PCP: Martie Slaughter, FNP    I am seeing Roy Boyd. in consultation for: BPH with nocturia    History of Present Illness:  Roy Boyd. is a 58 y.o. male who presents to the urology office for evaluation of the above chief complaint.  The patient reports following with Dr. Marley Simmers for BPH and nocturia.  Was placed on Flomax  0.4 mg q.day and Myrbetriq  50 mg q.day. he states he was on another medication that gave him side effects be can not recall that medication today.  He reports challenges with urinary urgency rare urge incontinence slow stream double voids sensation of incomplete void and nocturia 1-2 per night.  He reports no Rx side effects on Flomax  or Myrbetriq  on review.  The patient denies any history of gross hematuria, recurrent urinary tract infection, STD, personal history of calculus disease, family history of calculus disease, family history of prostate cancer and no history of urologic surgery.          History of Present Illness & Interval History:  ***. No hematuria, dysuria or UTI since last visit.     Past Medical History:   Diagnosis Date    Anxiety     Esophageal reflux     Hypercholesterolemia     Hypertension     Hypothyroidism     Rheumatoid arthritis (CMS HCC)       Past Surgical History:   Procedure Laterality Date    HX APPENDECTOMY      HX THYROIDECTOMY      HX TONSILLECTOMY      HX WISDOM TEETH EXTRACTION      SPINE SURGERY       Allergies   Allergen Reactions    Penicillins      Current Outpatient Medications   Medication Sig    baclofen (LIORESAL) 10 mg Oral Tablet Take 1 Tablet (10 mg total) by mouth Three times a day (Patient not taking: Reported on 05/24/2023)    famotidine (PEPCID) 40 mg Oral Tablet Take 1 Tablet (40 mg total) by mouth Every evening (Patient not  taking: Reported on 05/24/2023)    gabapentin (NEURONTIN) 300 mg Oral Capsule Take 1 Capsule (300 mg total) by mouth Twice daily    levothyroxine  (SYNTHROID) 200 mcg Oral Tablet Take 1 Tablet (200 mcg total) by mouth Every morning    linaCLOtide (LINZESS) 290 mcg Oral Capsule Take 1 Capsule (290 mcg total) by mouth Once a day    losartan (COZAAR) 25 mg Oral Tablet Take 1.5 Tablets (37.5 mg total) by mouth Once a day    omeprazole (PRILOSEC) 40 mg Oral Capsule, Delayed Release(E.C.) Take 1 Capsule (40 mg total) by mouth Once a day    oxyBUTYnin  chloride (DITROPAN  XL) 10 mg Oral Tablet Extended Rel 24 hr Take 1 Tablet (10 mg total) by mouth Once a day    rosuvastatin (CRESTOR) 10 mg Oral Tablet Take 1 Tablet (10 mg total) by mouth Once a day    tamsulosin  (FLOMAX ) 0.4 mg Oral Capsule Take 1 Capsule (0.4 mg total) by mouth Twice daily for 120 days    vibegron (GEMTESA) 75 mg Oral Tablet Take 1 Tablet (75 mg total) by mouth Once a day (Patient not taking: Reported on  05/24/2023)     Family History       Problem Relation Age of Onset Comments    Alzheimer's/Dementia Mother      Blood Clots Father      Cancer Brother      Cancer Father      Hypertension (High Blood Pressure) Father      Stroke Brother      Stroke Sister             Social History     Socioeconomic History    Marital status: Married   Tobacco Use    Smoking status: Never     Passive exposure: Never    Smokeless tobacco: Never   Vaping Use    Vaping status: Never Used   Substance and Sexual Activity    Alcohol use: Never    Drug use: Never    Sexual activity: Yes     Partners: Female     Social Determinants of Health     Financial Resource Strain: Low Risk  (09/06/2022)    Financial Resource Strain     SDOH Financial: No   Transportation Needs: Low Risk  (09/06/2022)    Transportation Needs     SDOH Transportation: No   Social Connections: Low Risk  (09/06/2022)    Social Connections     SDOH Social Isolation: 5 or more times a week   Intimate Partner Violence:  Low Risk  (09/06/2022)    Intimate Partner Violence     SDOH Domestic Violence: No   Housing Stability: Low Risk  (09/06/2022)    Housing Stability     SDOH Housing Situation: I have housing.     SDOH Housing Worry: No        Review of Systems:  A 12-point extended review of systems was performed.  All systems are negative, except for the new positives and/or changes that have been noted above in the history of present illness & interval history.    Afebrile, vital signs stable (please see rooming notes for details)    Physical Exam:      Constitutional: normal, alert, cooperative and no distress  Eyes: anicteric sclerae, extraocular eye movements intact and no ptosis  HENMT: normocephalic, atraumatic. nares are symmetric, no nasal flaring or respiratory distress. No obvious lesions or masses. No lip cyanosis.  Cardiovascular: no digital clubbing or cyanosis present, no palpable abdominal aortic aneurysm. No peripheral edema.  Pulmonary/Respiration: normal respiratory rate and non-labored, comfortable respiratory effort without recruitment of accessory respiratory muscles.   Abdominal: soft, non-distended, non-tender. No flank pain. No masses or hepatosplenomegaly.  Musculoskeletal: normal station and posture. Moves all extremities.  Neurological: cranial nerves II through XII intact  Psychiatric: alert, oriented, normal speech, no focal findings or movement disorder noted.  Exhibits appropriate insight during discussion.  Skin: normal coloration and turgor, no rashes, no suspicious skin lesions noted.  Hematological/Immunological: no ecchymosis, no petechiae, no bleeding gums and no jaundice  Lymphatic: no femoral or inguinal palpable lymphadenopathy    Diagnosis:  1. 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 Urgency with urge incontinence    Plan:  1. DC Myrbetriq   2. Continue Flomax  0.4 mg q.day and increase to b.i.d.   3. E prescribe oxybutynin  10 mg 1 p.o.  q.a.m. with instructions and precautions   4. Bladder scan post-void residual volume=63mL today.  5. Begin bladder irritants elimination diet  6. Start fluid restriction 2.5 hrs  before hs and voiding before bed   7. Return to clinic in 8 weeks with bladder scan PVR     Homero Luster. Zacheriah Stumpe, MD   [facility]

## 2023-07-30 ENCOUNTER — Ambulatory Visit: Payer: Self-pay | Attending: Urology | Admitting: Urology

## 2023-07-30 ENCOUNTER — Other Ambulatory Visit: Payer: Self-pay

## 2023-07-30 ENCOUNTER — Encounter (INDEPENDENT_AMBULATORY_CARE_PROVIDER_SITE_OTHER): Payer: Self-pay | Admitting: Urology

## 2023-07-30 VITALS — BP 145/88 | HR 89 | Ht 67.0 in | Wt 205.0 lb

## 2023-07-30 DIAGNOSIS — N401 Enlarged prostate with lower urinary tract symptoms: Secondary | ICD-10-CM | POA: Insufficient documentation

## 2023-07-30 DIAGNOSIS — N138 Other obstructive and reflux uropathy: Secondary | ICD-10-CM | POA: Insufficient documentation

## 2023-07-30 NOTE — Progress Notes (Signed)
 [facility]  Urologic Surgery Office Follow-Up Patient Visit Note    Name: Roy Boyd. MRN:  [id number]   Date of Birth: May 07, 1965 Age: 58 y.o.   Date: 07/30/2023         PCP: Brunetta Genera, FNP    I am seeing [name]. in consultation for: BPH with nocturia    History of Present Illness:  Roy Boyd. is a 58 y.o. male who presents to the urology office for evaluation of the above chief complaint.  The patient reports following with Dr. Louanne Belton for BPH and nocturia.  Was placed on Flomax 0.4 mg q.day and Myrbetriq 50 mg q.day. he states he was on another medication that gave him side effects be can not recall that medication today.  He reports challenges with urinary urgency rare urge incontinence slow stream double voids sensation of incomplete void and nocturia 1-2 per night.  He reports no Rx side effects on Flomax or Myrbetriq on review.  The patient denies any history of gross hematuria, recurrent urinary tract infection, STD, personal history of calculus disease, family history of calculus disease, family history of prostate cancer and no history of urologic surgery.          History of Present Illness & Interval History:  He reports improvement in his nocturia to 1 per night, but still has daytime urgency.  Reports significant hesitancy 1st thing in the morning that improves as the day goes on.  He reports no Rx side effects on dual therapy on review.  He states his symptoms are not bad enough to consider surgical intervention at this time. No hematuria, dysuria or UTI since last visit.     Past Medical History:   Diagnosis Date    Anxiety     Esophageal reflux     Hypercholesterolemia     Hypertension     Hypothyroidism     Rheumatoid arthritis (CMS HCC)       Past Surgical History:   Procedure Laterality Date    HX APPENDECTOMY      HX THYROIDECTOMY      HX TONSILLECTOMY      HX WISDOM TEETH EXTRACTION      SPINE SURGERY       Allergies   Allergen  Reactions    Penicillins      Current Outpatient Medications   Medication Sig    baclofen (LIORESAL) 10 mg Oral Tablet Take 1 Tablet (10 mg total) by mouth Three times a day (Patient not taking: Reported on 05/24/2023)    famotidine (PEPCID) 40 mg Oral Tablet Take 1 Tablet (40 mg total) by mouth Every evening (Patient not taking: Reported on 05/24/2023)    gabapentin (NEURONTIN) 300 mg Oral Capsule Take 1 Capsule (300 mg total) by mouth Twice daily    levothyroxine (SYNTHROID) 200 mcg Oral Tablet Take 1 Tablet (200 mcg total) by mouth Every morning    linaCLOtide (LINZESS) 290 mcg Oral Capsule Take 1 Capsule (290 mcg total) by mouth Once a day    losartan (COZAAR) 25 mg Oral Tablet Take 1.5 Tablets (37.5 mg total) by mouth Once a day    omeprazole (PRILOSEC) 40 mg Oral Capsule, Delayed Release(E.C.) Take 1 Capsule (40 mg total) by mouth Once a day    oxyBUTYnin chloride (DITROPAN XL) 10 mg Oral Tablet Extended Rel 24 hr Take 1 Tablet (10 mg total) by mouth Once a day    rosuvastatin (CRESTOR) 10 mg Oral Tablet  Take 1 Tablet (10 mg total) by mouth Once a day    tamsulosin (FLOMAX) 0.4 mg Oral Capsule Take 1 Capsule (0.4 mg total) by mouth Twice daily for 120 days    vibegron (GEMTESA) 75 mg Oral Tablet Take 1 Tablet (75 mg total) by mouth Once a day (Patient not taking: Reported on 05/24/2023)     Family History       Problem Relation Age of Onset Comments    Alzheimer's/Dementia Mother      Blood Clots Father      Cancer Brother      Cancer Father      Hypertension (High Blood Pressure) Father      Stroke Brother      Stroke Sister             Social History     Socioeconomic History    Marital status: Married   Tobacco Use    Smoking status: Never     Passive exposure: Never    Smokeless tobacco: Never   Vaping Use    Vaping status: Never Used   Substance and Sexual Activity    Alcohol use: Never    Drug use: Never    Sexual activity: Yes     Partners: Female     Social Determinants of Health     Financial Resource  Strain: Low Risk  (09/06/2022)    Financial Resource Strain     SDOH Financial: No   Transportation Needs: Low Risk  (09/06/2022)    Transportation Needs     SDOH Transportation: No   Social Connections: Low Risk  (09/06/2022)    Social Connections     SDOH Social Isolation: 5 or more times a week   Intimate Partner Violence: Low Risk  (09/06/2022)    Intimate Partner Violence     SDOH Domestic Violence: No   Housing Stability: Low Risk  (09/06/2022)    Housing Stability     SDOH Housing Situation: I have housing.     SDOH Housing Worry: No        Review of Systems:  A 12-point extended review of systems was performed.  All systems are negative, except for the new positives and/or changes that have been noted above in the history of present illness & interval history.    Afebrile, vital signs stable (please see rooming notes for details)    Physical Exam:      Constitutional: normal, alert, cooperative and no distress  Eyes: anicteric sclerae, extraocular eye movements intact and no ptosis  HENMT: normocephalic, atraumatic. nares are symmetric, no nasal flaring or respiratory distress. No obvious lesions or masses. No lip cyanosis.  Cardiovascular: no digital clubbing or cyanosis present, no palpable abdominal aortic aneurysm. No peripheral edema.  Pulmonary/Respiration: normal respiratory rate and non-labored, comfortable respiratory effort without recruitment of accessory respiratory muscles.   Abdominal: soft, non-distended, non-tender. No flank pain. No masses or hepatosplenomegaly.  Musculoskeletal: normal station and posture. Moves all extremities.  Neurological: cranial nerves II through XII intact  Psychiatric: alert, oriented, normal speech, no focal findings or movement disorder noted.  Exhibits appropriate insight during discussion.  Skin: normal coloration and turgor, no rashes, no suspicious skin lesions noted.  Hematological/Immunological: no ecchymosis, no petechiae, no bleeding gums and no  jaundice  Lymphatic: no femoral or inguinal palpable lymphadenopathy    Diagnosis:  1. BPH with nocturia 1-2 per night  +slow stream, sensation of incomplete void, double voids  2. Urgency with urge incontinence  -  Myrbetriq ineffective, discontinued    Plan:  1. His symptoms are improved but not resolved; he states that his symptoms are not significant or bothersome enough to warrant surgical intervention yet.  2. Continue Flomax 0.4 mg b.i.d.   3. Continue oxybutynin 10 mg 1 p.o. q.a.m.   4. Bladder scan post-void residual volume=28mL today, prior, at intake: 17mL  5. Revisit bladder irritants elimination diet  6. Revisit fluid restriction 2.5 hrs before hs and voiding before bed   7. We discussed cystoscopy as a reasonable next step  8. Return to clinic in 6 months with bladder scan PVR    Doralee Albino. Patterson Hollenbaugh, MD   [facility]

## 2023-07-30 NOTE — Nursing Note (Signed)
 07/30/23 0900   Urine Test   PVR Volume 83ml

## 2023-08-08 ENCOUNTER — Other Ambulatory Visit: Payer: Self-pay

## 2023-08-08 ENCOUNTER — Ambulatory Visit
Payer: Self-pay | Attending: INTERNAL MEDICINE-ENDOCRINOLOGY-DIABETES AND METABOLISM | Admitting: INTERNAL MEDICINE-ENDOCRINOLOGY-DIABETES AND METABOLISM

## 2023-08-08 ENCOUNTER — Encounter (INDEPENDENT_AMBULATORY_CARE_PROVIDER_SITE_OTHER): Payer: Self-pay | Admitting: INTERNAL MEDICINE-ENDOCRINOLOGY-DIABETES AND METABOLISM

## 2023-08-08 VITALS — BP 128/60 | HR 67 | Resp 17 | Ht 67.0 in | Wt 228.0 lb

## 2023-08-08 DIAGNOSIS — Z85850 Personal history of malignant neoplasm of thyroid: Secondary | ICD-10-CM | POA: Insufficient documentation

## 2023-08-08 DIAGNOSIS — E782 Mixed hyperlipidemia: Secondary | ICD-10-CM | POA: Insufficient documentation

## 2023-08-08 DIAGNOSIS — Z79890 Hormone replacement therapy (postmenopausal): Secondary | ICD-10-CM | POA: Insufficient documentation

## 2023-08-08 DIAGNOSIS — Z125 Encounter for screening for malignant neoplasm of prostate: Secondary | ICD-10-CM | POA: Insufficient documentation

## 2023-08-08 DIAGNOSIS — S29012A Strain of muscle and tendon of back wall of thorax, initial encounter: Secondary | ICD-10-CM | POA: Insufficient documentation

## 2023-08-08 DIAGNOSIS — E039 Hypothyroidism, unspecified: Secondary | ICD-10-CM | POA: Insufficient documentation

## 2023-08-08 DIAGNOSIS — G4733 Obstructive sleep apnea (adult) (pediatric): Secondary | ICD-10-CM | POA: Insufficient documentation

## 2023-08-08 DIAGNOSIS — F419 Anxiety disorder, unspecified: Secondary | ICD-10-CM | POA: Insufficient documentation

## 2023-08-08 DIAGNOSIS — E890 Postprocedural hypothyroidism: Secondary | ICD-10-CM | POA: Insufficient documentation

## 2023-08-08 DIAGNOSIS — E119 Type 2 diabetes mellitus without complications: Secondary | ICD-10-CM | POA: Insufficient documentation

## 2023-08-08 DIAGNOSIS — I491 Atrial premature depolarization: Secondary | ICD-10-CM | POA: Insufficient documentation

## 2023-08-08 DIAGNOSIS — R252 Cramp and spasm: Secondary | ICD-10-CM | POA: Insufficient documentation

## 2023-08-08 MED ORDER — LEVOTHYROXINE 200 MCG TABLET
200.0000 ug | ORAL_TABLET | Freq: Every morning | ORAL | 1 refills | Status: DC
Start: 2023-08-08 — End: 2023-11-30

## 2023-08-08 NOTE — Progress Notes (Signed)
 Endocrinology, [facility]  [address]  [place] [address]  [phone]    Date: 08/08/2023  Patient Name:  Roy Boyd.   Age: 58 y.o.  Attending: Gerianne Kobs, MD - Endocrinology, Diabetes and Metabolism  PCP: Martie Slaughter, FNP    I have personally reviewed allergies, chief complaint, medications, past medical history, surgical history, and family history.  I have reviewed referral notes, PCP progress notes and labs and images relevant to this visit.      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 History of C spine surgery with hardware placement  - severe physical limitations given surgery, but states he was going to be paralyzed otherwise    No follow-ups on file.    Chief Complaint:    Chief Complaint   Patient presents with    Follow Up 3 Months     History of Present Illness:   58 year old male presents for evaluation and management of hypothyroidism.  Has a history of thyroid cancer, status post total thyroidectomy 1995.  Had radioactive iodine.  Followed with an endocrinologist and states that he had to have his dose adjusted about twice a year.  He is taking his medication consistently and correctly.  States he had some thyroid tissue or a nodule that had grown back in the thyroid bed but his endocrinologist told him it was not concerning and he has not had repeated for a long time.  Endocrinologist moved out of state.  Last TSH was 0.39.  On levothyroxine 125.   Was lowered from 150.  Discussed needs wait about 6 weeks and have labs again.      09/06/2022:  58 year old male returns for follow-up.  States he feels really tired.  Needs to have thyroid labs.    03/13/2023:  58 year old male returns for follow-up.  Still with fatigue.  He is largely sedentary due to C-spine surgery and physical limitations from met.  Most of his fatigue is start after he stopped working.  He has also gained weight.  We went up on thyroid hormone replacement his TSH was 2.2.    08/08/2023:  58 year old male returns for follow-up.  Feels better with higher thyroid dosing.  No over replacement symptoms.  Allergies:  Allergies   Allergen Reactions    Penicillins      Medications:    Outpatient Medications Marked as Taking for the 08/08/23 encounter (Office Visit) with Terrye Fiedler, MD   Medication Sig    baclofen (LIORESAL) 10 mg Oral Tablet Take 1 Tablet (10 mg total) by mouth Three times a day    famotidine (PEPCID) 40 mg Oral Tablet Take 1 Tablet (40 mg total) by mouth Every evening    levothyroxine (SYNTHROID) 200 mcg Oral Tablet Take 1 Tablet (200 mcg total) by mouth Every morning    losartan (COZAAR) 25 mg Oral Tablet Take 1.5 Tablets (37.5 mg total) by mouth Daily  omeprazole (PRILOSEC) 40 mg Oral Capsule, Delayed Release(E.C.) Take 1 Capsule (40 mg total) by mouth Daily    oxyBUTYnin chloride (DITROPAN XL) 10 mg Oral Tablet Extended Rel 24 hr Take 1 Tablet (10 mg total) by mouth Once a day    tamsulosin (FLOMAX) 0.4 mg Oral Capsule Take 1 Capsule (0.4 mg total) by mouth Twice daily for 120 days     Past Medical History:  Past Medical History:   Diagnosis Date    Anxiety     Esophageal reflux     Hypercholesterolemia     Hypertension     Hypothyroidism     Rheumatoid arthritis      Past Surgical History:   Procedure Laterality Date    HX APPENDECTOMY      HX THYROIDECTOMY      HX TONSILLECTOMY      HX WISDOM TEETH EXTRACTION      SPINE SURGERY       Social History:    Social History      Socioeconomic History    Marital status: Married   Tobacco Use    Smoking status: Never     Passive exposure: Never    Smokeless tobacco: Never   Vaping Use    Vaping status: Never Used   Substance and Sexual Activity    Alcohol use: Never    Drug use: Never    Sexual activity: Yes     Partners: Female     Social Determinants of Health     Financial Resource Strain: Low Risk  (09/06/2022)    Financial Resource Strain     SDOH Financial: No   Transportation Needs: Low Risk  (09/06/2022)    Transportation Needs     SDOH Transportation: No   Social Connections: Low Risk  (09/06/2022)    Social Connections     SDOH Social Isolation: 5 or more times a week   Intimate Partner Violence: Low Risk  (09/06/2022)    Intimate Partner Violence     SDOH Domestic Violence: No   Housing Stability: Low Risk  (09/06/2022)    Housing Stability     SDOH Housing Situation: I have housing.     SDOH Housing Worry: No     Family History:  Family Medical History:       Problem Relation (Age of Onset)    Alzheimer's/Dementia Mother    Blood Clots Father    Cancer Father, Brother    Hypertension (High Blood Pressure) Father    Stroke Sister, Brother          Objective:     Vital Signs:  BP 128/60   Pulse 67   Resp 17   Ht 1.702 m (5\' 7" )   Wt 103 kg (228 lb)   SpO2 93%   BMI 35.71 kg/m      Body mass index is 35.71 kg/m.    Examination:  General:  Age-appropriate, no acute distress  HEENT:  Eyes clear.  Extraocular movements intact. Moist mucous membranes.  Neck:  thyroid is surgically absent  Cardiac:  Regular rate and rhythm, no gallops, murmurs or rubs.  Pulmonary:  Clear to ausculation bilaterally.  Pulmonary effort normal.  Gastrointestinal:  Nontender, nondistended, bowel sounds positive.    Musculoskeletal:  No deformity or lesions.  Skin:  Warm and of normal moisture.  Neurologic:  Alert and oriented.  Nonfocal.  No tremor.  Gait normal.  Psychiatric:  Affect normal.    Diagnostic Review:  Last BMP  (  Last result in the past 2  years)        Na   K   Cl   CO2   BUN   Cr   Calcium   Glucose   Glucose-Fasting        09/06/22 1055 138   3.9   108   24   20   0.96   9.4  Comment: Gadolinium-containing contrast can interfere with calcium measurement.     119               Last Hepatic Panel  (Last result in the past 2 years)        Albumin   Total PTN   Total Bili   Direct Bili   Ast/SGOT   Alt/SGPT   Alk Phos        11/28/22 1318 32.1                         Last BMP  (Last result in the past 2 years)        Na   K   Cl   CO2   BUN   Cr   Calcium   Glucose   Glucose-Fasting        09/06/22 1055 138   3.9   108   24   20   0.96   9.4  Comment: Gadolinium-containing contrast can interfere with calcium measurement.     119               No results found for: "HA1C"  Lab Results   Component Value Date    TSH 2.609 09/06/2022    FREET4 1.13 09/06/2022     TSH (uIU/mL)   Date Value   09/06/2022 2.609     Sheryle Hail, MD  Endocrinology, Diabetes and Metabolism  This note may have been partially generated using MModal Fluency Direct system, and there may be some incorrect words, spellings, and punctuation that were not noted in checking the note before saving.

## 2023-10-11 ENCOUNTER — Ambulatory Visit (INDEPENDENT_AMBULATORY_CARE_PROVIDER_SITE_OTHER): Payer: Self-pay | Admitting: Urology

## 2023-10-28 ENCOUNTER — Other Ambulatory Visit (INDEPENDENT_AMBULATORY_CARE_PROVIDER_SITE_OTHER): Payer: Self-pay | Admitting: Urology

## 2023-11-30 ENCOUNTER — Other Ambulatory Visit (INDEPENDENT_AMBULATORY_CARE_PROVIDER_SITE_OTHER): Payer: Self-pay | Admitting: INTERNAL MEDICINE-ENDOCRINOLOGY-DIABETES AND METABOLISM

## 2023-11-30 MED ORDER — LEVOTHYROXINE 175 MCG TABLET
175.0000 ug | ORAL_TABLET | Freq: Every morning | ORAL | 4 refills | Status: AC
Start: 2023-11-30 — End: ?

## 2023-12-05 ENCOUNTER — Telehealth (INDEPENDENT_AMBULATORY_CARE_PROVIDER_SITE_OTHER): Payer: Self-pay | Admitting: INTERNAL MEDICINE-ENDOCRINOLOGY-DIABETES AND METABOLISM

## 2023-12-05 NOTE — Telephone Encounter (Signed)
 Roy Norleen NOVAK, MD  [name], [place], [place]  Thyroid  dose is too high. Decreased his script from 200mcg daily to 175mcg daily          Patient notified.bnr,ma

## 2024-01-28 ENCOUNTER — Ambulatory Visit (INDEPENDENT_AMBULATORY_CARE_PROVIDER_SITE_OTHER): Payer: Self-pay | Admitting: Urology

## 2024-01-28 DIAGNOSIS — N401 Enlarged prostate with lower urinary tract symptoms: Secondary | ICD-10-CM

## 2024-01-28 NOTE — Progress Notes (Deleted)
 [place]  [facility]  Urologic Surgery Office Follow-Up Patient Visit Note    Name: Roy Boyd. MRN:  [id number]   Date of Birth: [birth year] Age: 58 y.o.   Date: 01/28/2024         PCP: Sari LITTIE Blush, FNP    I am seeing Roy Boyd. in consultation for: BPH with nocturia    History of Present Illness:  Roy Boyd. is a 58 y.o. male who presents to the urology office for evaluation of the above chief complaint.  The patient reports following with Dr. Chaney for BPH and nocturia.  Was placed on Flomax  0.4 mg q.day and Myrbetriq  50 mg q.day. he states he was on another medication that gave him side effects be can not recall that medication today.  He reports challenges with urinary urgency rare urge incontinence slow stream double voids sensation of incomplete void and nocturia 1-2 per night.  He reports no Rx side effects on Flomax  or Myrbetriq  on review.  The patient denies any history of gross hematuria, recurrent urinary tract infection, STD, personal history of calculus disease, family history of calculus disease, family history of prostate cancer and no history of urologic surgery.          History of Present Illness & Interval History:  He reports improvement in his nocturia to 1 per night, but still has daytime urgency.  Reports significant hesitancy 1st thing in the morning that improves as the day goes on.  He reports no Rx side effects on dual therapy on review.  He states his symptoms are not bad enough to consider surgical intervention at this time. No hematuria, dysuria or UTI since last visit.     History of Present Illness & Interval History:  ***. No hematuria, dysuria or UTI since last visit.     Past Medical History:   Diagnosis Date    Anxiety     Esophageal reflux     Hypercholesterolemia     Hypertension     Hypothyroidism     Rheumatoid arthritis       Past Surgical History:   Procedure Laterality Date    HX APPENDECTOMY      HX THYROIDECTOMY      HX  TONSILLECTOMY      HX WISDOM TEETH EXTRACTION      SPINE SURGERY       Allergies   Allergen Reactions    Penicillins      Current Outpatient Medications   Medication Sig    baclofen (LIORESAL) 10 mg Oral Tablet Take 1 Tablet (10 mg total) by mouth Three times a day    famotidine (PEPCID) 40 mg Oral Tablet Take 1 Tablet (40 mg total) by mouth Every evening    levothyroxine  (SYNTHROID) 175 mcg Oral Tablet Take 1 Tablet (175 mcg total) by mouth Every morning    losartan (COZAAR) 25 mg Oral Tablet Take 1.5 Tablets (37.5 mg total) by mouth Daily    omeprazole (PRILOSEC) 40 mg Oral Capsule, Delayed Release(E.C.) Take 1 Capsule (40 mg total) by mouth Daily    oxyBUTYnin  chloride (DITROPAN  XL) 10 mg Oral Tablet Extended Rel 24 hr Take 1 Tablet (10 mg total) by mouth Once a day    tamsulosin  (FLOMAX ) 0.4 mg Oral Capsule Take 1 Capsule (0.4 mg total) by mouth Twice daily for 120 days     Family History       Problem Relation Age of Onset Comments    Alzheimer's/Dementia Mother  Blood Clots Father      Cancer Brother      Cancer Father      Hypertension (High Blood Pressure) Father      Stroke Brother      Stroke Sister             Social History     Socioeconomic History    Marital status: Married   Tobacco Use    Smoking status: Never     Passive exposure: Never    Smokeless tobacco: Never   Vaping Use    Vaping status: Never Used   Substance and Sexual Activity    Alcohol use: Never    Drug use: Never    Sexual activity: Yes     Partners: Female     Social Determinants of Health     Financial Resource Strain: Low Risk (09/06/2022)    Financial Resource Strain     SDOH Financial: No   Transportation Needs: Low Risk (09/06/2022)    Transportation Needs     SDOH Transportation: No   Social Connections: Low Risk (09/06/2022)    Social Connections     SDOH Social Isolation: 5 or more times a week   Intimate Partner Violence: Low Risk (09/06/2022)    Intimate Partner Violence     SDOH Domestic Violence: No   Housing Stability:  Low Risk (09/06/2022)    Housing Stability     SDOH Housing Situation: I have housing.     SDOH Housing Worry: No        Review of Systems:  A 12-point extended review of systems was performed.  All systems are negative, except for the new positives and/or changes that have been noted above in the history of present illness & interval history.    Afebrile, vital signs stable (please see rooming notes for details)    Physical Exam:      Constitutional: normal, alert, cooperative and no distress  Eyes: anicteric sclerae, extraocular eye movements intact and no ptosis  HENMT: normocephalic, atraumatic. nares are symmetric, no nasal flaring or respiratory distress. No obvious lesions or masses. No lip cyanosis.  Cardiovascular: no digital clubbing or cyanosis present, no palpable abdominal aortic aneurysm. No peripheral edema.  Pulmonary/Respiration: normal respiratory rate and non-labored, comfortable respiratory effort without recruitment of accessory respiratory muscles.   Abdominal: soft, non-distended, non-tender. No flank pain. No masses or hepatosplenomegaly.  Musculoskeletal: normal station and posture. Moves all extremities.  Neurological: cranial nerves II through XII intact  Psychiatric: alert, oriented, normal speech, no focal findings or movement disorder noted.  Exhibits appropriate insight during discussion.  Skin: normal coloration and turgor, no rashes, no suspicious skin lesions noted.  Hematological/Immunological: no ecchymosis, no petechiae, no bleeding gums and no jaundice  Lymphatic: no femoral or inguinal palpable lymphadenopathy    Diagnosis:  1. BPH with nocturia 1-2 per night  +slow stream, sensation of incomplete void, double voids  2. Urgency with urge incontinence  -Myrbetriq  ineffective, discontinued    Plan:  1. His symptoms are improved but not resolved; he states that his symptoms are not significant or bothersome enough to warrant surgical intervention yet.  2. Continue Flomax  0.4 mg  b.i.d.   3. Continue oxybutynin  10 mg 1 p.o. q.a.m.   4. Bladder scan post-void residual volume=14mL today, prior, at intake: 17mL  5. Revisit bladder irritants elimination diet  6. Revisit fluid restriction 2.5 hrs before hs and voiding before bed   7. We discussed cystoscopy as a reasonable  next step  8. Return to clinic in 6 months with bladder scan PVR    Ozell DEL. Shama Monfils, MD   [facility]rology

## 2024-02-07 ENCOUNTER — Ambulatory Visit (INDEPENDENT_AMBULATORY_CARE_PROVIDER_SITE_OTHER): Payer: Self-pay | Admitting: INTERNAL MEDICINE-ENDOCRINOLOGY-DIABETES AND METABOLISM

## 2024-03-07 ENCOUNTER — Other Ambulatory Visit: Payer: Self-pay

## 2024-03-07 ENCOUNTER — Encounter (HOSPITAL_COMMUNITY): Payer: Self-pay

## 2024-03-07 ENCOUNTER — Emergency Department (HOSPITAL_COMMUNITY): Payer: Self-pay

## 2024-03-07 ENCOUNTER — Emergency Department
Admission: EM | Admit: 2024-03-07 | Discharge: 2024-03-07 | Disposition: A | Discharge status: Home / Self Care | Payer: Self-pay

## 2024-03-07 DIAGNOSIS — N4889 Other specified disorders of penis: Secondary | ICD-10-CM | POA: Insufficient documentation

## 2024-03-07 DIAGNOSIS — N5089 Other specified disorders of the male genital organs: Secondary | ICD-10-CM | POA: Insufficient documentation

## 2024-04-08 NOTE — H&P (Deleted)
 [facility]  [address]  [place] [address]  Dept: [phone]  Dept Fax: [phone]      New patient H&P    04/10/2024    Roy Boyd.  Date of Birth. 1965/10/03  MRN: [id number]  Referring Physician:  No ref. provider found      No chief complaint on file.      History of present illness:58 y.o. male evaluated by Urology at [facility] 07/30/23. History of BPH and nocturia. Prescribed Flomax  and myrbetriq  40 mg. Note indicates he's been prescribed oxybutynin  10 mg and gemtesa.     The post void residual today in office is *** cc.     AUA symptom score ***. Quality of life score ***    No results for input(s): PSA in the last 00000 hours.    Scrotal u/s 09/07/22 personally reviewed. Small b/l hydroceles. B/l varicoceles.   Scrotal u/s 03/07/24 personally reviewed. B/L varicoceles. Small right hydrocele. Trace left hydrocele    PMH:   Past Medical History:   Diagnosis Date    Anxiety     Esophageal reflux     Hypercholesterolemia     Hypertension     Hypothyroidism     Rheumatoid arthritis          Past Surgical History:   Procedure Laterality Date    HX APPENDECTOMY      HX THYROIDECTOMY      HX TONSILLECTOMY      HX WISDOM TEETH EXTRACTION      SPINE SURGERY           Current Outpatient Medications   Medication Sig    baclofen (LIORESAL) 10 mg Oral Tablet Take 1 Tablet (10 mg total) by mouth Three times a day    famotidine (PEPCID) 40 mg Oral Tablet Take 1 Tablet (40 mg total) by mouth Every evening    levothyroxine  (SYNTHROID) 175 mcg Oral Tablet Take 1 Tablet (175 mcg total) by mouth Every morning    losartan (COZAAR) 25 mg Oral Tablet Take 2 Tablets (50 mg total) by mouth Twice daily    omeprazole (PRILOSEC) 40 mg Oral Capsule, Delayed Release(E.C.) Take 1 Capsule (40 mg total) by mouth Daily    tamsulosin  (FLOMAX ) 0.4 mg Oral Capsule Take 1 Capsule (0.4 mg total) by mouth Twice daily for 120 days     Allergies[1]  Social History     Socioeconomic History    Marital status: Married     Spouse  name: Not on file    Number of children: Not on file    Years of education: Not on file    Highest education level: Not on file   Occupational History    Not on file   Tobacco Use    Smoking status: Never     Passive exposure: Never    Smokeless tobacco: Never   Vaping Use    Vaping status: Never Used   Substance and Sexual Activity    Alcohol use: Never    Drug use: Never    Sexual activity: Yes     Partners: Female   Other Topics Concern    Not on file   Social History Narrative    Not on file     Social Determinants of Health     Financial Resource Strain: Low Risk (09/06/2022)    Financial Resource Strain     SDOH Financial: No   Transportation Needs: Low Risk (09/06/2022)    Transportation Needs  SDOH Transportation: No   Social Connections: Low Risk (09/06/2022)    Social Connections     SDOH Social Isolation: 5 or more times a week   Intimate Partner Violence: Low Risk (09/06/2022)    Intimate Partner Violence     SDOH Domestic Violence: No   Housing Stability: Low Risk (09/06/2022)    Housing Stability     SDOH Housing Situation: I have housing.     SDOH Housing Worry: No     Family Medical History:       Problem Relation (Age of Onset)    Alzheimer's/Dementia Mother    Blood Clots Father    Cancer Father, Brother    Hypertension (High Blood Pressure) Father    Stroke Sister, Brother              Review of systems:    As per history of present illness or negative for constitutional symptoms, ENT, cardiovascular, respiratory, gastrointestinal, genitourinary; musculoskeletal, skin and/or breasts, neurological, psychiatric, endocrine, hematologic, lymphatic, and allergic immunologic. Also, as per urology H&P forms scanned into the computer.    Physical examination:    There were no vitals taken for this visit.       General: Well-developed, well-nourished male in no apparent distress.  HEENT: Grossly normal.  Chest: Normal excursion.  Abdomen: Soft, without tenderness or mass.  Skin: Warm and dry.  Extremities:  Moves all extremities.  Neurologic: Alert and oriented in three spheres.      Labs:     Lab Results   Component Value Date/Time    GLUCOSE 119 09/06/2022 10:55 AM    WBC 6.6 09/06/2022 10:55 AM    RBC 6.25 (H) 09/06/2022 10:55 AM         No results found for this or any previous visit (from the past 72 hours).      No results for input(s): PSA in the last 00000 hours.    Recent Labs     11/28/22  1318   CREATININE, RANDOM, U 233.1          Radiographic data:    No results found for this or any previous visit.    No results found for this or any previous visit.    No results found for this or any previous visit.    No results found for this or any previous visit.    Results for orders placed during the hospital encounter of 03/07/24    US  SCROTUM    Narrative  Roy Kinnear JR.  Male, 58 years old.    US  SCROTUM performed on 03/07/2024 2:57 PM.    REASON FOR EXAM:  Right testicular swelling    COMPARISON: Prior scrotal ultrasound dated Sep 07, 2022.    FINDINGS: The right testicle measures 3.7 x 2.1 x 3.1 cm and left testicle measures 4.2 x 2.2 x 2.8 cm. There is blood flow seen to both testicles. Bilateral varicoceles are noted. No evidence of testicular mass. There is a small right hydrocele. Bilateral epididymis appears unremarkable. Trace left hydrocele noted.    Impression  No evidence of testicular mass or testicular torsion. Bilateral varicoceles. Small right and trace left hydrocele.                    Radiologist location ID: [id number]    No valid procedures specified.  No valid procedures specified.    No valid procedures specified.    Procedures:    PVR=****    Impression:  No diagnosis found.          Plan:    No follow-ups on file.    ***      Patient instructed to call office if any problems or changes in condition.        No orders of the defined types were placed in this encounter.        Teriann Livingood, PA-C   [facility]           [1]   Allergies  Allergen Reactions    Penicillins

## 2024-04-10 ENCOUNTER — Ambulatory Visit (HOSPITAL_BASED_OUTPATIENT_CLINIC_OR_DEPARTMENT_OTHER): Payer: Self-pay | Admitting: Physician Assistant

## 2024-06-26 ENCOUNTER — Ambulatory Visit (INDEPENDENT_AMBULATORY_CARE_PROVIDER_SITE_OTHER): Payer: Self-pay | Admitting: INTERNAL MEDICINE-ENDOCRINOLOGY-DIABETES AND METABOLISM
